# Patient Record
Sex: Male | Born: 1955
Health system: Southern US, Community
[De-identification: ages and names within clinical notes are randomized; demographics above are authoritative.]

## PROBLEM LIST (undated history)

## (undated) DIAGNOSIS — R9389 Abnormal findings on diagnostic imaging of other specified body structures: Secondary | ICD-10-CM

## (undated) DIAGNOSIS — Z9889 Other specified postprocedural states: Secondary | ICD-10-CM

## (undated) DIAGNOSIS — R112 Nausea with vomiting, unspecified: Secondary | ICD-10-CM

## (undated) DIAGNOSIS — S46009A Unspecified injury of muscle(s) and tendon(s) of the rotator cuff of unspecified shoulder, initial encounter: Secondary | ICD-10-CM

## (undated) DIAGNOSIS — K644 Residual hemorrhoidal skin tags: Secondary | ICD-10-CM

## (undated) HISTORY — DX: Unspecified injury of muscle(s) and tendon(s) of the rotator cuff of unspecified shoulder, initial encounter: S46.009A

## (undated) HISTORY — DX: Residual hemorrhoidal skin tags: K64.4

## (undated) HISTORY — DX: Abnormal findings on diagnostic imaging of other specified body structures: R93.89

---

## 1987-11-22 HISTORY — PX: OTHER SURGICAL HISTORY: SHX169

## 1998-11-21 HISTORY — PX: ROTATOR CUFF REPAIR: SHX139

## 2008-08-01 ENCOUNTER — Ambulatory Visit: Payer: Self-pay | Admitting: Internal Medicine

## 2008-08-01 DIAGNOSIS — M719 Bursopathy, unspecified: Secondary | ICD-10-CM

## 2008-08-01 DIAGNOSIS — K644 Residual hemorrhoidal skin tags: Secondary | ICD-10-CM | POA: Insufficient documentation

## 2008-08-01 DIAGNOSIS — M67919 Unspecified disorder of synovium and tendon, unspecified shoulder: Secondary | ICD-10-CM | POA: Insufficient documentation

## 2008-09-15 ENCOUNTER — Ambulatory Visit: Payer: Self-pay | Admitting: Unknown Physician Specialty

## 2010-02-25 ENCOUNTER — Ambulatory Visit: Payer: Self-pay | Admitting: Unknown Physician Specialty

## 2010-02-25 ENCOUNTER — Encounter: Payer: Self-pay | Admitting: Cardiology

## 2010-03-01 DIAGNOSIS — R930 Abnormal findings on diagnostic imaging of skull and head, not elsewhere classified: Secondary | ICD-10-CM

## 2010-03-02 ENCOUNTER — Ambulatory Visit: Payer: Self-pay | Admitting: Cardiology

## 2010-03-02 DIAGNOSIS — I2510 Atherosclerotic heart disease of native coronary artery without angina pectoris: Secondary | ICD-10-CM | POA: Insufficient documentation

## 2010-03-02 DIAGNOSIS — E782 Mixed hyperlipidemia: Secondary | ICD-10-CM

## 2010-03-03 ENCOUNTER — Encounter: Payer: Self-pay | Admitting: Cardiology

## 2010-03-03 ENCOUNTER — Ambulatory Visit: Payer: Self-pay | Admitting: Internal Medicine

## 2010-03-04 LAB — CONVERTED CEMR LAB
BUN: 21 mg/dL (ref 6–23)
Calcium: 9.6 mg/dL (ref 8.4–10.5)
Creatinine, Ser: 0.89 mg/dL (ref 0.40–1.50)
Potassium: 4.5 meq/L (ref 3.5–5.3)

## 2010-03-08 ENCOUNTER — Ambulatory Visit: Payer: Self-pay | Admitting: Cardiology

## 2010-03-08 ENCOUNTER — Ambulatory Visit (HOSPITAL_COMMUNITY): Admission: RE | Admit: 2010-03-08 | Discharge: 2010-03-08 | Payer: Self-pay | Admitting: Cardiology

## 2010-12-21 NOTE — Assessment & Plan Note (Signed)
Summary: NP6/Abnormal Ct-mb   Visit Type:  new pt visit Primary Provider:  Italy McQueen  CC:  no cardiac complaints today...  History of Present Illness: Eric Vargas comes in today for evaluation and management of  an abnormal CT scan on February 25, 2010 for coronary artery calcifications.  He is totally asymptomatic without angina or ischemic symptoms. He is an active runner. He does a lot of manual work as well.  His risk factors include age over 60, male sex, mild hyperlipidemia with total cholesterol 220 but a good HDL per his recollection, and family history. He also dips on occasion.  He denies any dyspnea on exertion, orthopnea, PND, palpitations or syncope. He does not smoke.  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 Neck:  Neck supple, no JVD. No masses, thyromegaly or abnormal cervical nodes. Chest Porschia [place]:  no deformities or breast masses  noted Lungs:  Clear bilaterally to auscultation and percussion. Heart:  Non-displaced PMI, chest non-tender; regular rate and rhythm, S1, S2 without murmurs, rubs or gallops. Carotid upstroke normal, no bruit. Normal abdominal aortic size, no bruits. Femorals normal pulses, no bruits. Pedals normal pulses. No edema, no varicosities. Abdomen:  Bowel sounds positive; abdomen soft and non-tender without masses, organomegaly, or hernias noted. No hepatosplenomegaly. Msk:  Back normal, normal gait. Muscle strength and tone normal. Pulses:  pulses normal in all 4 extremities Extremities:  No clubbing or cyanosis. Neurologic:  Alert and oriented x 3.   Problems:  Medical Problems Added: 1)  Dx of Hyperlipidemia Type Iib / Iii  (ICD-272.2) 2)  Dx of Hyperlipidemia Type Iib / Iii  (ICD-272.2) 3)  Dx of Cad, Unspecified Site  (ICD-414.00)  EKG  Procedure date:  03/02/2010  Findings:      sinus bradycardia, normal EKG otherwise  Impression & Recommendations:  Problem # 1:  CAD, UNSPECIFIED SITE (ICD-414.00) Assessment New I have explained to him that his coronary calcification on his CT scan is consistent with endothelial thickening, and there could be some plaque formation as well. His most significant risk factors size his age and being a male is his cholesterol 220. He states his HDL is good, however, I suspected the wise to decrease his LDL to 70. I recommend low-dose statin. He like to think about this. I also recommended low-dose aspirin 81 mg a day to decrease the risk of an acute thrombosis if plaque rupture were  to occur. He like to think about this as well.  Arrange to have a cardiac CT with Dr. Jearld Pies on Monday, 18 April. Indications risks potential benefits have been discussed. He understands his insurance problem not pay. He is fine with this.  Problem # 2:  CT, CHEST, ABNORMAL (ICD-793.1) Assessment: New See above. No other abnormalities.CT scan reviewed. Orders: EKG w/  Interpretation (93000) Cardiac CTA (Cardiac CTA)  Problem # 3:  HYPERLIPIDEMIA TYPE IIB / III (ICD-272.2) Assessment: Unchanged See  above discussion and recommendations.  Patient Instructions: 1)  Your physician recommends that you schedule a follow-up appointment in: AS NEEDED 2)  Your physician recommends that you continue on your current medications as directed. Please refer to the Current Medication list given to you today. 3)  Your physician has requested that you have a cardiac CT.  Cardiac computed tomography (CT) is a painless test that uses an x-ray machine to take clear, detailed pictures of your heart.  For further information please visit [email or web address]  Please follow instruction sheet as given.

## 2011-06-07 ENCOUNTER — Encounter: Payer: Self-pay | Admitting: Cardiology

## 2012-08-08 ENCOUNTER — Ambulatory Visit (INDEPENDENT_AMBULATORY_CARE_PROVIDER_SITE_OTHER): Payer: 59 | Admitting: Sports Medicine

## 2012-08-08 VITALS — BP 120/70 | Ht 70.0 in | Wt 200.0 lb

## 2012-08-08 DIAGNOSIS — M79609 Pain in unspecified limb: Secondary | ICD-10-CM

## 2012-08-08 DIAGNOSIS — M79662 Pain in left lower leg: Secondary | ICD-10-CM | POA: Insufficient documentation

## 2012-08-08 NOTE — Progress Notes (Signed)
Chief complaint: Left calf pain. Mr. Eric Vargas is a 56 year old male who is a runner coming in with left calf pain. Patient states she's had this problem intermittently for the last 5 years. Patient states though that it has increased in frequency as well as duration of pain. Patient states that he can start running in usually go approximately 3 miles before he starts having pain in his left calf, more on the lateral aspect. Patient states that this feels like a cramp but then has significant soreness thereafter. Patient then has soreness for the next 2-3 days afterwards where he is unable to tolerate any type of exercise. Patient denies any radiation of pain or any numbness or weakness in the extremity. Patient denies any true injury to that side previously. Patient also denies any large from injury to the left side. Patient denies any weakness numbness in the extremity distally. Patient also denies any foot drop. Patient has been told that he is an over pronator and has tried to get shoes to try to help but does not seem to help significantly. Patient has not tried ice or over-the-counter anti-inflammatories to this point. Patient is also not tried stretching. Patient was a sprinter previously In high school as well as in college and only in the last 10 years has he started doing longer distances.  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  Review of systems as stated in the history of present illness  Past Medical History  Diagnosis Date  . Abnormal chest CT   . Rotator cuff injury     Right shoulder  . Hemorrhoids, external    Past Surgical History  Procedure Date  . Disk repair 1989    At [facility]  . Rotator cuff repair 2000    Left   No family history on file. History  Substance Use Topics  . Smoking status: Never Smoker   . Smokeless tobacco: Not on file   . Alcohol Use: Yes   No Known Allergies  Physical Exam: Filed Vitals:   08/08/12 1017  BP: 120/70  Gen: No apparent distress alert and oriented x3 mood and affect are normal Respiratory: Patient speaks in full senses and does not appear short of breath Neuro: Patient is neurovascularly intact in all extremities, 2+ DTRs and symmetric in all extremities. Skin: Warm dry and intact no signs of rash Left lower extremity: On inspection no abnormality. Patient's calf laterally on palpation is nontender, patient's calf size measures symmetrically to the contralateral side. Patient has full flexion and extension at the ankle with no pain. Patient is able to stand on his toes without pain. Patient's hamstrings are minorly tight. On standing examination patient does have some mild overpronation of the hindfoot bilaterally as well as pes planus bilaterally. Patient has no significant varus or valgus deformity of the hindfoot. Gait analysis: Patient  walks he actually strikes on his forefoot in a supinated stance with varus deformity of the lower extremity. Feet turn in somewhat with left greater than right. When patient does jog this is exacerbated more. Patient jogs with a significant forefoot striking causing patient's calf to be tight throughout the striking and pushing off phase. Patient is neurovascularly intact distally with 2+ DTRs of the Achilles.    Patient was placed in sports insoles with heel lifts bilaterally that did help with  patient's overpronation patient states that running felt much more comfortable.

## 2012-08-08 NOTE — Assessment & Plan Note (Signed)
Patient's calf pain is likely exacerbated by patient's running style with forefoot striking as well as supination. Patient likely then has significant cramping of the lateral head of the gastrocnemius on the left side. Patient was given heel lifts in sports insults today to try to decrease the strain during running. In addition to this patient was given eccentric exercises to work on the Achilles as well as the calf. Patient also given the compression sleeve to wear on the left calf to try to help keep muscle warm during exercise and told to wear this 2 hours even after exercise. Patient will followup in one month's time if still having problem.  At that time will consider doing an ultrasound of the left calf looking for some type of tear but due to patient's duration of symptoms I do not think this is likely. The other possibility in the differential would be compression of the sural nerve by the compression by the lateral gastrocnemius head.

## 2012-08-08 NOTE — Patient Instructions (Signed)
Good to meet you. Wear the calf compression with activity and 2 hours afterward.  Wear the sports insoles when you run as well.  Do the exercises daily to help with stretching.  Come back in 1 month if still having trouble.

## 2015-05-03 ENCOUNTER — Encounter: Payer: Self-pay | Admitting: Emergency Medicine

## 2015-05-03 ENCOUNTER — Emergency Department: Payer: BLUE CROSS/BLUE SHIELD

## 2015-05-03 ENCOUNTER — Emergency Department
Admission: EM | Admit: 2015-05-03 | Discharge: 2015-05-03 | Disposition: A | Discharge status: Home / Self Care | Payer: BLUE CROSS/BLUE SHIELD | Attending: Emergency Medicine | Admitting: Emergency Medicine

## 2015-05-03 DIAGNOSIS — Z23 Encounter for immunization: Secondary | ICD-10-CM | POA: Diagnosis not present

## 2015-05-03 DIAGNOSIS — Y998 Other external cause status: Secondary | ICD-10-CM | POA: Insufficient documentation

## 2015-05-03 DIAGNOSIS — Y9289 Other specified places as the place of occurrence of the external cause: Secondary | ICD-10-CM | POA: Diagnosis not present

## 2015-05-03 DIAGNOSIS — W450XXA Nail entering through skin, initial encounter: Secondary | ICD-10-CM | POA: Insufficient documentation

## 2015-05-03 DIAGNOSIS — S99921A Unspecified injury of right foot, initial encounter: Secondary | ICD-10-CM

## 2015-05-03 DIAGNOSIS — S91331A Puncture wound without foreign body, right foot, initial encounter: Secondary | ICD-10-CM | POA: Insufficient documentation

## 2015-05-03 DIAGNOSIS — Y9389 Activity, other specified: Secondary | ICD-10-CM | POA: Diagnosis not present

## 2015-05-03 MED ORDER — TETANUS-DIPHTH-ACELL PERTUSSIS 5-2.5-18.5 LF-MCG/0.5 IM SUSP
0.5000 mL | Freq: Once | INTRAMUSCULAR | Status: AC
  Administered 2015-05-03: 0.5 mL via INTRAMUSCULAR

## 2015-05-03 MED ORDER — CIPROFLOXACIN HCL 500 MG PO TABS: 500.0000 mg | ORAL_TABLET | Freq: Two times a day (BID) | ORAL | Status: AC

## 2015-05-03 MED ORDER — TETANUS-DIPHTH-ACELL PERTUSSIS 5-2.5-18.5 LF-MCG/0.5 IM SUSP
INTRAMUSCULAR | Status: AC
  Administered 2015-05-03: 0.5 mL via INTRAMUSCULAR
  Filled 2015-05-03: qty 0.5

## 2015-05-03 NOTE — ED Notes (Signed)
Pt left prior to being D/C. Pt left after exam.

## 2015-05-03 NOTE — ED Notes (Signed)
Pt [place] on a rusty nail that came up through his shoe. Pt states nail was submerged in salt water. Pt states last tetanus shot is unknown.

## 2015-05-03 NOTE — ED Notes (Signed)
Pt states he stepped on rusty nail yesterday, unsure of when his last tetanus shot was. Presents today to get a tetanus shot.

## 2015-05-03 NOTE — ED Provider Notes (Signed)
[facility] Emergency Department Provider Note  ____________________________________________  Time seen: Approximately 9:55 AM  I have reviewed the triage vital signs and the nursing notes.   HISTORY  Chief Complaint Foot Injury   HPI Eric Vargas is a 59 y.o. male presents to the ER for the complaint of stepping on rusty nail in ocean yesterday and right foot. Patient says he is working on steps to go down into the water and states that he stepped directly on a nail sticking out of a board. Patient states that he had on rubber soled shoes and now to penetrate through his shoe. Patient states that nail was immediately pulled out as he pulled up his foot. Denies other injury.  States pain is currently a 4 out of 10 described as tenderness. Denies numbness or team and sensation. Denies other pain or injury.   Past Medical History  Diagnosis Date  . Abnormal chest CT   . Rotator cuff injury     Right shoulder  . Hemorrhoids, external     Patient Active Problem List   Diagnosis Date Noted  . Pain of left calf 08/08/2012  . HYPERLIPIDEMIA TYPE IIB / III 03/02/2010  . CAD, UNSPECIFIED SITE 03/02/2010  . Nonspecific (abnormal) findings on radiological and other examination of body structure 03/01/2010  . CT, CHEST, ABNORMAL 03/01/2010  . HEMORRHOIDS, EXTERNAL 08/01/2008  . ROTATOR CUFF INJURY, RIGHT SHOULDER 08/01/2008    Past Surgical History  Procedure Laterality Date  . Disk repair  1989    At [facility]  . Rotator cuff repair  2000    Left    No current outpatient prescriptions on file.  Allergies Review of patient's allergies indicates no known allergies.  History reviewed. No pertinent family history.  Social History History  Substance Use Topics  . Smoking status: Never Smoker   . Smokeless tobacco: Current User    Types: Chew  . Alcohol Use: Yes     Comment: occassionally    Review of Systems Constitutional: No fever/chills Eyes:  No visual changes. ENT: No sore throat. Cardiovascular: Denies chest pain. Respiratory: Denies shortness of breath. Gastrointestinal: No abdominal pain.  No nausea, no vomiting.  No diarrhea.  No constipation. Genitourinary: Negative for dysuria. Musculoskeletal: Negative for back pain. Positive for right foot pain Skin: Negative for rash. Neurological: Negative for headaches, focal weakness or numbness.  10-point ROS otherwise negative.  ____________________________________________   PHYSICAL EXAM:  VITAL SIGNS: ED Triage Vitals  Enc Vitals Group     BP 05/03/15 0915 132/87 mmHg     Pulse Rate 05/03/15 0915 67     Resp 05/03/15 0915 18     Temp 05/03/15 0915 98.1 F (36.7 C)     Temp Source 05/03/15 0915 Oral     SpO2 05/03/15 0915 97 %     Weight 05/03/15 0915 200 lb (90.719 kg)     Height 05/03/15 0915 5\' 10"  (1.778 m)     Head Cir --      Peak Flow --      Pain Score 05/03/15 0911 3     Pain Loc --      Pain Edu? --      Excl. in [place]? --     Constitutional: Alert and oriented. Well appearing and in no acute distress. Eyes: Conjunctivae are normal. PERRL. EOMI. Head: Atraumatic. Nose: No congestion/rhinnorhea. Mouth/Throat: Mucous membranes are moist.  Oropharynx non-erythematous. Neck: No stridor.  Hematological/Lymphatic/Immunilogical: No cervical lymphadenopathy. Cardiovascular: Normal rate,  regular rhythm. Grossly normal heart sounds.  Good peripheral circulation. Respiratory: Normal respiratory effort.  No retractions. Lungs CTAB. Gastrointestinal: Soft and nontender. No distention.  Musculoskeletal: No lower extremity tenderness nor edema.  No joint effusions. Except: Right plantar midfoot small area appearance of puncture wound. Clean, no surrounding erythema, induration or fluctuance. Mild TTP. Full ROM. Sensation intact to bilateral feet. Cap refill <2seconds. Distal pedal pulses equal bilaterally.  Neurologic:  Normal speech and language. No gross focal  neurologic deficits are appreciated. Speech is normal. No gait instability. Skin:  Skin is warm, dry and intact. No rash noted. Psychiatric: Mood and affect are normal. Speech and behavior are normal.  ________________________________________  RADIOLOGY  RIGHT FOOT COMPLETE - 3+ VIEW  COMPARISON: None.  FINDINGS: The site of the injury is not indicated. There is no evidence of acute fracture, dislocation or bone destruction. No radiopaque foreign body or soft tissue emphysema demonstrated. Mild midfoot and first MTP degenerative changes are noted. The tibial sesamoid of the first metatarsal is bipartite. Probable accessory navicular.  IMPRESSION: No acute osseous findings, foreign body or soft tissue emphysema demonstrated.   Electronically Signed By: Richardean Sale M.D. On: 05/03/2015 10:14 _______________________________________________________________________________________   INITIAL IMPRESSION / ASSESSMENT AND PLAN / ED COURSE  Pertinent labs & imaging results that were available during my care of the patient were reviewed by me and considered in my medical decision making (see chart for details).  Well appearing. NO acute distress. Right foot plantar nail injury through rubber soled shoe. X-ray negative for foreign body. No erythema, no signs of infection. Tetanus immunization given in ER. Will start PO ciprofloxacin and follow up with PCP.   1025: Went to room to discuss xray findings with pt and pt not in room.   1035: pt still not in room. Pt left ER unannounced. RN notified. Prescription and discharge information printed and given to RN. Pt alert and oriented with decisional capacity.  ____________________________________________   FINAL CLINICAL IMPRESSION(S) / ED DIAGNOSES  Final diagnoses:  Injury of plantar aspect of foot, right, initial encounter  Puncture wound of foot, right, initial encounter  stepped on nail right plantar foot    Marylene Land, NP 05/03/15 [place], MD 05/03/15 1059

## 2015-05-03 NOTE — Discharge Instructions (Signed)
Keep clean. Apply topical antibiotic ointment. Elevate foot. Medication as prescribed.  Follow-up with her primary care physician or with the above as needed.  Return to the ER for new or worsening concerns.

## 2016-12-20 ENCOUNTER — Telehealth: Payer: Self-pay

## 2016-12-20 NOTE — Telephone Encounter (Signed)
Patient called to be scheduled for a colonoscopy.

## 2016-12-21 ENCOUNTER — Other Ambulatory Visit: Payer: Self-pay

## 2016-12-21 ENCOUNTER — Encounter: Payer: Self-pay | Admitting: Gastroenterology

## 2016-12-21 ENCOUNTER — Ambulatory Visit (INDEPENDENT_AMBULATORY_CARE_PROVIDER_SITE_OTHER): Payer: BLUE CROSS/BLUE SHIELD | Admitting: Gastroenterology

## 2016-12-21 VITALS — BP 133/87 | HR 74 | Temp 97.7°F | Ht 70.0 in | Wt 223.0 lb

## 2016-12-21 DIAGNOSIS — R109 Unspecified abdominal pain: Secondary | ICD-10-CM | POA: Diagnosis not present

## 2016-12-21 NOTE — Patient Instructions (Addendum)
You are scheduled for a CT Scan Abdomen/Pelvis with contrast at [facility] location on Wednesday, Feb 7th at 8:00am. Please arrive at 7:30am and have nothing to eat or drink 4 hours prior.

## 2016-12-21 NOTE — Progress Notes (Signed)
Gastroenterology Consultation  Referring Provider:     No ref. provider found Primary Care Physician:  No PCP Per Patient Primary Gastroenterologist:  Dr. Allen Norris     Reason for Consultation:     Abdominal pain        HPI:   Eric Vargas is a 61 y.o. y/o male referred for consultation & management of Abdominal pain by Dr. Rayne Du PCP Per Patient.  This patient comes in today with a history of abdominal pain.  The patient states the abdominal pain started approximately 1 month ago.  The patient was significantly worse one month ago but has gotten better but has not completely resolved.  The patient denies the abdominal pain to be need any better or worse with eating or drinking.  He also denies any association with his bowel movements.  The patient has not had any unexplained weight loss.  The patient does report that he was sent a letter by his insurance company stating that he was due for another colonoscopy.  There is no report of any laxatives or bloody stools.  The patient also reports that he only has the abdominal pain now when he pushes on his abdomen.  Past Medical History:  Diagnosis Date  . Abnormal chest CT   . Hemorrhoids, external   . Rotator cuff injury    Right shoulder    Past Surgical History:  Procedure Laterality Date  . Disk repair  1989   At [facility]  . ROTATOR CUFF REPAIR  2000   Left    Prior to Admission medications   Medication Sig Start Date End Date Taking? Authorizing Provider  meclizine (ANTIVERT) 25 MG tablet  12/02/16   Historical Provider, MD  TRANSDERM-SCOP, 1.5 MG, 1 MG/3DAYS  12/02/16   Historical Provider, MD    History reviewed. No pertinent family history.   Social History  Substance Use Topics  . Smoking status: Never Smoker  . Smokeless tobacco: Current User    Types: Chew  . Alcohol use Yes     Comment: occassionally    Allergies as of 12/21/2016  . (No Known Allergies)    Review of Systems:    All systems reviewed and negative  except where noted in HPI.   Physical Exam:  BP 133/87   Pulse 74   Temp 97.7 F (36.5 C) (Oral)   Ht 5\' 10"  (1.778 m)   Wt 223 lb (101.2 kg)   BMI 32.00 kg/m  No LMP for male patient. Psych:  Alert and cooperative. Normal mood and affect. General:   Alert,  Well-developed, well-nourished, pleasant and cooperative in NAD Head:  Normocephalic and atraumatic. Eyes:  Sclera clear, no icterus.   Conjunctiva pink. Ears:  Normal auditory acuity. Nose:  No deformity, discharge, or lesions. Mouth:  No deformity or lesions,oropharynx pink & moist. Neck:  Supple; no masses or thyromegaly. Lungs:  Respirations even and unlabored.  Clear throughout to auscultation.   No wheezes, crackles, or rhonchi. No acute distress. Heart:  Regular rate and rhythm; no murmurs, clicks, rubs, or gallops. Abdomen:  Normal bowel sounds.  No bruits.  Soft, Tenderness on deep palpation of his abdomen and more on the left than on the right but both in the suprapubic area and non-distended without masses, hepatosplenomegaly or hernias noted.  No guarding or rebound tenderness.  Negative Carnett sign.   Rectal:  Deferred.  Msk:  Symmetrical without gross deformities.  Good, equal movement & strength bilaterally. Pulses:  Normal pulses  noted. Extremities:  No clubbing or edema.  No cyanosis. Neurologic:  Alert and oriented x3;  grossly normal neurologically. Skin:  Intact without significant lesions or rashes.  No jaundice. Lymph Nodes:  No significant cervical adenopathy. Psych:  Alert and cooperative. Normal mood and affect.  Imaging Studies: No results found.  Assessment and Plan:   Eric Vargas is a 61 y.o. y/o male who is in need of a screening colonoscopy after receiving a letter from his insurance coverage. The patient has abdominal pain but the abdominal pain is not related to eating drinking or any bowel movements.  The patient may have some extraintestinal cause for his abdominal pain including kidney  stones versus epiploic appendagitis.  The patient will be set up for CT scan of the abdomen and pelvis in addition to his screening colonoscopy.  The patient has been explained the plan and agrees with it.    [name], MD. Marval Regal   Note: This dictation was prepared with Dragon dictation along with smaller phrase technology. Any transcriptional errors that result from this process are unintentional.

## 2016-12-22 NOTE — Telephone Encounter (Signed)
Was seen in office prior to scheduling colonoscopy. Set up for 12/30/16.

## 2016-12-27 NOTE — Discharge Instructions (Signed)

## 2016-12-28 ENCOUNTER — Ambulatory Visit
Admission: RE | Admit: 2016-12-28 | Discharge: 2016-12-28 | Disposition: A | Discharge status: Home / Self Care | Payer: BLUE CROSS/BLUE SHIELD | Source: Ambulatory Visit | Attending: Gastroenterology | Admitting: Gastroenterology

## 2016-12-28 DIAGNOSIS — R109 Unspecified abdominal pain: Secondary | ICD-10-CM | POA: Insufficient documentation

## 2016-12-28 DIAGNOSIS — K760 Fatty (change of) liver, not elsewhere classified: Secondary | ICD-10-CM | POA: Diagnosis not present

## 2016-12-28 DIAGNOSIS — M48061 Spinal stenosis, lumbar region without neurogenic claudication: Secondary | ICD-10-CM | POA: Diagnosis not present

## 2016-12-28 DIAGNOSIS — K5730 Diverticulosis of large intestine without perforation or abscess without bleeding: Secondary | ICD-10-CM | POA: Diagnosis not present

## 2016-12-28 DIAGNOSIS — I708 Atherosclerosis of other arteries: Secondary | ICD-10-CM | POA: Insufficient documentation

## 2016-12-28 DIAGNOSIS — M47896 Other spondylosis, lumbar region: Secondary | ICD-10-CM | POA: Insufficient documentation

## 2016-12-28 DIAGNOSIS — R918 Other nonspecific abnormal finding of lung field: Secondary | ICD-10-CM | POA: Diagnosis not present

## 2016-12-28 DIAGNOSIS — I700 Atherosclerosis of aorta: Secondary | ICD-10-CM | POA: Diagnosis not present

## 2016-12-28 MED ORDER — IOPAMIDOL (ISOVUE-300) INJECTION 61%
125.0000 mL | Freq: Once | INTRAVENOUS | Status: AC | PRN
  Administered 2016-12-28: 125 mL via INTRAVENOUS

## 2016-12-30 ENCOUNTER — Encounter
Admission: RE | Disposition: A | Discharge status: Home / Self Care | Payer: Self-pay | Source: Ambulatory Visit | Attending: Gastroenterology

## 2016-12-30 ENCOUNTER — Ambulatory Visit: Payer: BLUE CROSS/BLUE SHIELD | Admitting: Anesthesiology

## 2016-12-30 ENCOUNTER — Ambulatory Visit
Admission: RE | Admit: 2016-12-30 | Discharge: 2016-12-30 | Disposition: A | Discharge status: Home / Self Care | Payer: BLUE CROSS/BLUE SHIELD | Source: Ambulatory Visit | Attending: Gastroenterology | Admitting: Gastroenterology

## 2016-12-30 DIAGNOSIS — K5730 Diverticulosis of large intestine without perforation or abscess without bleeding: Secondary | ICD-10-CM | POA: Diagnosis not present

## 2016-12-30 DIAGNOSIS — K640 First degree hemorrhoids: Secondary | ICD-10-CM | POA: Diagnosis not present

## 2016-12-30 DIAGNOSIS — Z01818 Encounter for other preprocedural examination: Secondary | ICD-10-CM

## 2016-12-30 DIAGNOSIS — I2510 Atherosclerotic heart disease of native coronary artery without angina pectoris: Secondary | ICD-10-CM | POA: Insufficient documentation

## 2016-12-30 DIAGNOSIS — Z1211 Encounter for screening for malignant neoplasm of colon: Secondary | ICD-10-CM | POA: Diagnosis not present

## 2016-12-30 HISTORY — PX: COLONOSCOPY WITH PROPOFOL: SHX5780

## 2016-12-30 SURGERY — COLONOSCOPY WITH PROPOFOL
Anesthesia: Monitor Anesthesia Care | Wound class: Contaminated

## 2016-12-30 MED ORDER — LACTATED RINGERS IV SOLN
INTRAVENOUS | Status: DC
  Administered 2016-12-30: 08:00:00 via INTRAVENOUS

## 2016-12-30 MED ORDER — ACETAMINOPHEN 160 MG/5ML PO SOLN: 325.0000 mg | ORAL | Status: DC | PRN

## 2016-12-30 MED ORDER — SIMETHICONE 40 MG/0.6ML PO SUSP
ORAL | Status: DC | PRN
  Administered 2016-12-30: 08:00:00

## 2016-12-30 MED ORDER — PROPOFOL 10 MG/ML IV BOLUS
INTRAVENOUS | Status: DC | PRN
  Administered 2016-12-30 (×2): 40 mg via INTRAVENOUS
  Administered 2016-12-30: 60 mg via INTRAVENOUS
  Administered 2016-12-30 (×2): 40 mg via INTRAVENOUS

## 2016-12-30 MED ORDER — LIDOCAINE HCL (CARDIAC) 20 MG/ML IV SOLN
INTRAVENOUS | Status: DC | PRN
  Administered 2016-12-30: 30 mg via INTRAVENOUS

## 2016-12-30 MED ORDER — ACETAMINOPHEN 325 MG PO TABS: 325.0000 mg | ORAL_TABLET | ORAL | Status: DC | PRN

## 2016-12-30 SURGICAL SUPPLY — 23 items

## 2016-12-30 NOTE — Op Note (Signed)
[facility] Gastroenterology Patient Name: Eric Vargas Procedure Date: 12/30/2016 8:00 AM MRN: [id number] Account #: [id number] Date of Birth: 04/29/1956 Admit Type: Outpatient Age: 61 Room: [facility] OR ROOM 01 Gender: Male Note Status: Finalized Procedure:            Colonoscopy Indications:          Screening for colorectal malignant neoplasm Providers:            [name] MD, MD Referring MD:         Roena Malady, MD (Referring MD) Medicines:            Propofol per Anesthesia Complications:        No immediate complications. Procedure:            Pre-Anesthesia Assessment:                       - Prior to the procedure, a History and Physical was                        performed, and patient medications and allergies were                        reviewed. The patient's tolerance of previous                        anesthesia was also reviewed. The risks and benefits of                        the procedure and the sedation options and risks were                        discussed with the patient. All questions were                        answered, and informed consent was obtained. Prior                        Anticoagulants: The patient has taken no previous                        anticoagulant or antiplatelet agents. ASA Grade                        Assessment: II - A patient with mild systemic disease.                        After reviewing the risks and benefits, the patient was                        deemed in satisfactory condition to undergo the                        procedure.                       After obtaining informed consent, the colonoscope was                        passed under direct vision. Throughout the procedure,  the patient's blood pressure, pulse, and oxygen                        saturations were monitored continuously. The Olympus CF                        H180AL Colonoscope (S#: [id number]) was introduced  through                        the anus and advanced to the the cecum, identified by                        appendiceal orifice and ileocecal valve. The                        colonoscopy was performed without difficulty. The                        patient tolerated the procedure well. The quality of                        the bowel preparation was excellent. Findings:      The perianal and digital rectal examinations were normal.      A few small-mouthed diverticula were found in the sigmoid colon.      Non-bleeding internal hemorrhoids were found during retroflexion. The       hemorrhoids were Grade I (internal hemorrhoids that do not prolapse). Impression:           - Diverticulosis in the sigmoid colon.                       - Non-bleeding internal hemorrhoids.                       - No specimens collected. Recommendation:       - Discharge patient to home.                       - Resume previous diet.                       - Continue present medications.                       - Await pathology results. Procedure Code(s):    --- Professional ---                       [phone], Colonoscopy, flexible; diagnostic, including                        collection of specimen(s) by brushing or washing, when                        performed (separate procedure) Diagnosis Code(s):    --- Professional ---                       Z12.11, Encounter for screening for malignant neoplasm                        of colon CPT copyright 2016 American Medical Association. All rights reserved. The codes  documented in this report are preliminary and upon coder review may  be revised to meet current compliance requirements. [name] MD, MD 12/30/2016 8:29:02 AM This report has been signed electronically. Number of Addenda: 0 Note Initiated On: 12/30/2016 8:00 AM Scope Withdrawal Time: 0 hours 7 minutes 18 seconds  Total Procedure Duration: 0 hours 8 minutes 53 seconds       [facility]

## 2016-12-30 NOTE — H&P (Signed)
  [name], MD [address] [place], [place] [address] Phone: [phone] Fax : [phone]  Primary Care Physician:  No PCP Per Patient Primary Gastroenterologist:  Dr. Allen Norris  Pre-Procedure History & Physical: HPI:  Eric Vargas is a 61 y.o. male is here for a screening colonoscopy.   Past Medical History:  Diagnosis Date  . Abnormal chest CT   . Hemorrhoids, external   . Rotator cuff injury    Right AND LEFT shoulder    Past Surgical History:  Procedure Laterality Date  . Disk repair  1989   At [facility]  . ROTATOR CUFF REPAIR  2000   Left AND RIGHT    Prior to Admission medications   Medication Sig Start Date End Date Taking? Authorizing Provider  Bioflavonoid Products (ESTER C PO) Take by mouth daily.   Yes Historical Provider, MD  meclizine (ANTIVERT) 25 MG tablet  12/02/16   Historical Provider, MD  TRANSDERM-SCOP, 1.5 MG, 1 MG/3DAYS  12/02/16   Historical Provider, MD    Allergies as of 12/21/2016  . (No Known Allergies)    History reviewed. No pertinent family history.  Social History   Social History  . Marital status: Married    Spouse name: N/A  . Number of children: 4  . Years of education: N/A   Occupational History  . [place], Special educational needs teacher    Social History Main Topics  . Smoking status: Never Smoker  . Smokeless tobacco: Current User    Types: Chew  . Alcohol use Yes     Comment: occassionally  . Drug use: No  . Sexual activity: Not on file   Other Topics Concern  . Not on file   Social History Narrative   Farms.   Separated.   Runs occasionally.    Review of Systems: See HPI, otherwise negative ROS  Physical Exam: BP 132/76   Pulse 66   Temp 98.8 F (37.1 C) (Tympanic)   Resp 16   Ht 5\' 10"  (1.778 m)   Wt 214 lb (97.1 kg)   SpO2 99%   BMI 30.71 kg/m  General:   Alert,  pleasant and cooperative in NAD Head:  Normocephalic and atraumatic. Neck:  Supple; no masses or thyromegaly. Lungs:   Clear throughout to auscultation.    Heart:  Regular rate and rhythm. Abdomen:  Soft, nontender and nondistended. Normal bowel sounds, without guarding, and without rebound.   Neurologic:  Alert and  oriented x4;  grossly normal neurologically.  Impression/Plan: TAISEAN RISKO is now here to undergo a screening colonoscopy.  Risks, benefits, and alternatives regarding colonoscopy have been reviewed with the patient.  Questions have been answered.  All parties agreeable.

## 2016-12-30 NOTE — Anesthesia Preprocedure Evaluation (Signed)
Anesthesia Evaluation  Patient identified by MRN, date of birth, ID band Patient awake    Reviewed: Allergy & Precautions, NPO status , Patient's Chart, lab work & pertinent test results  Airway Mallampati: II  TM Distance: >3 FB Neck ROM: Full    Dental   Pulmonary    Pulmonary exam normal        Cardiovascular + CAD  Normal cardiovascular exam     Neuro/Psych    GI/Hepatic   Endo/Other    Renal/GU      Musculoskeletal   Abdominal   Peds  Hematology   Anesthesia Other Findings   Reproductive/Obstetrics                             Anesthesia Physical Anesthesia Plan  ASA: II  Anesthesia Plan: MAC   Post-op Pain Management:    Induction: Intravenous  Airway Management Planned:   Additional Equipment:   Intra-op Plan:   Post-operative Plan:   Informed Consent: I have reviewed the patients History and Physical, chart, labs and discussed the procedure including the risks, benefits and alternatives for the proposed anesthesia with the patient or authorized representative who has indicated his/her understanding and acceptance.     Plan Discussed with: CRNA  Anesthesia Plan Comments:         Anesthesia Quick Evaluation

## 2016-12-30 NOTE — Anesthesia Postprocedure Evaluation (Signed)
Anesthesia Post Note  Patient: Eric Vargas  Procedure(s) Performed: Procedure(s) (LRB): COLONOSCOPY WITH PROPOFOL (N/A)  Patient location during evaluation: PACU Anesthesia Type: MAC Level of consciousness: awake and alert Pain management: pain level controlled Vital Signs Assessment: post-procedure vital signs reviewed and stable Respiratory status: spontaneous breathing, nonlabored ventilation, respiratory function stable and patient connected to nasal cannula oxygen Cardiovascular status: stable and blood pressure returned to baseline Anesthetic complications: no    Amaryllis Dyke

## 2016-12-30 NOTE — Transfer of Care (Signed)
Immediate Anesthesia Transfer of Care Note  Patient: Eric Vargas  Procedure(s) Performed: Procedure(s) with comments: COLONOSCOPY WITH PROPOFOL (N/A) - wants to be as early as possible  Patient Location: PACU  Anesthesia Type: MAC  Level of Consciousness: awake, alert  and patient cooperative  Airway and Oxygen Therapy: Patient Spontanous Breathing and Patient connected to supplemental oxygen  Post-op Assessment: Post-op Vital signs reviewed, Patient's Cardiovascular Status Stable, Respiratory Function Stable, Patent Airway and No signs of Nausea or vomiting  Post-op Vital Signs: Reviewed and stable  Complications: No apparent anesthesia complications

## 2017-01-02 ENCOUNTER — Encounter: Payer: Self-pay | Admitting: Gastroenterology

## 2017-01-05 ENCOUNTER — Telehealth: Payer: Self-pay

## 2017-01-05 NOTE — Telephone Encounter (Signed)
-----   Message from [name], MD sent at 12/29/2016 10:50 AM EST ----- Let the patient know that his CT scan showed diverticulosis, some fatty liver and a nodule in the right middle lobe of his lung that was recommended to repeat in 12 months with a chest CT to make sure that it has not progressed.

## 2017-01-05 NOTE — Telephone Encounter (Signed)
Pt notified of CT scan results and the need for repeat chest CT in 1 year.

## 2017-05-11 ENCOUNTER — Other Ambulatory Visit: Payer: Self-pay | Admitting: Unknown Physician Specialty

## 2017-05-11 DIAGNOSIS — R42 Dizziness and giddiness: Secondary | ICD-10-CM

## 2017-05-18 ENCOUNTER — Ambulatory Visit
Admission: RE | Admit: 2017-05-18 | Discharge: 2017-05-18 | Disposition: A | Discharge status: Home / Self Care | Payer: BLUE CROSS/BLUE SHIELD | Source: Ambulatory Visit | Attending: Unknown Physician Specialty | Admitting: Unknown Physician Specialty

## 2017-05-18 DIAGNOSIS — R42 Dizziness and giddiness: Secondary | ICD-10-CM | POA: Diagnosis not present

## 2017-05-18 MED ORDER — GADOBENATE DIMEGLUMINE 529 MG/ML IV SOLN
20.0000 mL | Freq: Once | INTRAVENOUS | Status: AC | PRN
  Administered 2017-05-18: 19 mL via INTRAVENOUS

## 2017-12-26 DIAGNOSIS — R42 Dizziness and giddiness: Secondary | ICD-10-CM | POA: Diagnosis not present

## 2017-12-26 DIAGNOSIS — H8110 Benign paroxysmal vertigo, unspecified ear: Secondary | ICD-10-CM | POA: Diagnosis not present

## 2017-12-27 ENCOUNTER — Other Ambulatory Visit: Payer: Self-pay | Admitting: Unknown Physician Specialty

## 2017-12-27 DIAGNOSIS — R42 Dizziness and giddiness: Secondary | ICD-10-CM

## 2017-12-29 ENCOUNTER — Ambulatory Visit: Payer: BLUE CROSS/BLUE SHIELD | Attending: Unknown Physician Specialty | Admitting: Physical Therapy

## 2017-12-29 ENCOUNTER — Encounter: Payer: Self-pay | Admitting: Physical Therapy

## 2017-12-29 ENCOUNTER — Other Ambulatory Visit: Payer: Self-pay | Admitting: Unknown Physician Specialty

## 2017-12-29 ENCOUNTER — Other Ambulatory Visit: Payer: Self-pay

## 2017-12-29 DIAGNOSIS — R42 Dizziness and giddiness: Secondary | ICD-10-CM | POA: Diagnosis not present

## 2017-12-29 DIAGNOSIS — H8112 Benign paroxysmal vertigo, left ear: Secondary | ICD-10-CM | POA: Diagnosis not present

## 2017-12-29 NOTE — Therapy (Signed)
[place] MAIN [facility] SERVICES [address] [place], [place], [address] Phone: [phone]   Fax:  [phone]  Physical Therapy Evaluation  Patient Details  Name: Eric Vargas MRN: [id number] Date of Birth: 1956-07-22 No Data Recorded  Encounter Date: 12/29/2017  PT End of Session - 12/29/17 1051    Visit Number  1    Number of Visits  17    Date for PT Re-Evaluation  02/23/18    PT Start Time  1055    PT Stop Time  1155    PT Time Calculation (min)  60 min    Activity Tolerance  Patient tolerated treatment well    Behavior During Therapy  [facility] for tasks assessed/performed       Past Medical History:  Diagnosis Date  . Abnormal chest CT   . Hemorrhoids, external   . Rotator cuff injury    Right AND LEFT shoulder    Past Surgical History:  Procedure Laterality Date  . COLONOSCOPY WITH PROPOFOL N/A 12/30/2016   Procedure: COLONOSCOPY WITH PROPOFOL;  Surgeon: [name], MD;  Location: [place];  Service: Endoscopy;  Laterality: N/A;  wants to be as early as possible  . Disk repair  1989   At [facility]  . ROTATOR CUFF REPAIR  2000   Left AND RIGHT    There were no vitals filed for this visit.     VESTIBULAR AND BALANCE EVALUATION  HISTORY:  Subjective history of current problem: Patient report he had sudden onset vertigo 8 weeks ago when he woke up and tried to get up to use the restroom. Patient reports that he primarily sleeps on his right side and that he had been tossing and turning the night of onset of his symptoms. Patient reports he has a "sensitive inner ear" and reports he has been getting nausea at times in addition to vertigo. Patient states he received a prescription for Valium and Meclizine from Dr. Gemma Payor for treatment of his vestibular symptoms. Patient states he does not like to take the Meclizine and is currently not taking the Meclizine. Patient reports that he has been going 1 or 2 times a week for 8  weeks to physical therapy for treatment of BPPV. Patient states his symptoms have minimally improved thus far with therapy. Patient reports that the PT tried the Bar BQ roll and the Epley manuever. Patient reports the PT also tried a procedure to try to "unstick it where you lean over to the right and turn your head".  Patient reports that he has been sleeping in a recliner chair since onset of his symptoms because he reports lying down and turning his head brings on his symptoms. Patient reports that turning his head to the right will cause everything to spin upwards and reports he gets mild spinning sensation with left head turns. Patient denies any recent falls or head injuries, but states he had a sinus infection a few weeks before onset of this symptoms 8 weeks ago. Patient reports that he feels he is more sensitive to caffeine now and states it "seems like I am on a different planet" after drinking a beverage with caffeine. Patient reports that he has been out of work for the past 8 weeks secondary to his vestibular issues. 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   Description of dizziness: vertigo, unsteadiness,   Frequency:  reports it occurs every time he lies down and turns his Duration: 30-45 seconds Symptom nature: motion provoked, positional Provocative Factors: lying down, rolling to the right and mildly to left, tipping head to the right Easing Factors: meclizine helped with the nausea, sitting upright   Progression of symptoms: better- reports "nothing is spinning the way it was"  History of similar episodes: yes, 6 months ago patient reports that he went to ENT physician and received vestibular PT. Patient reports the PT was able to resolved the BPPV and states it took 4-6 weeks Falls (yes/no): no Number of falls in past 6 months:  none   Auditory complaints (tinnitus, pain, drainage): tinnitus in right ear  that patient states is chronic Vision (last eye exam, diplopia, recent changes): denies   EXAMINATION        COORDINATION: Finger to Nose:  Normal Past Pointing:   Normal  MUSCULOSKELETAL SCREEN: Cervical Spine ROM: AROM cervical ROM WFL without pain   Gait: Patient arrives to clinic ambulating without AD. Patient ambulates with good cadence, reciprocal arm swing with good step length. Patient is able to ambulate at varying gait speeds without evidence of imbalance and able to perform quick turns L and R without imbalance.  Scanning of visual environment with gait is: good  Balance: Plan to test modified CTSIB next session.   OCULOMOTOR / VESTIBULAR TESTING:  Oculomotor Exam- Room Light  Normal Abnormal Comments  Ocular Alignment N    Ocular ROM N    Spontaneous Nystagmus N    End-Gaze Nystagmus N    Smooth Pursuit N    Saccades  ABN Hypometric saccades to the right side and noted a few hypometric saccades to the left  VOR   Deferred secondary to positive BPPV testing  VOR Cancellation N    Left Head Thrust   Deferred secondary to positive BPPV testing  Right Head Thrust   Deferred secondary to positive BPPV testing  Head Shaking Nystagmus   Deferred secondary to positive BPPV testing    BPPV TESTS:  Symptoms Duration Intensity Nystagmus  L Dix-Hallpike none   none  R Dix-Hallpike none 70-90 seconds  After about 5-10 second latency noted a few lateral beats and then noted downbeating left torsional nystagmus which lasted about 70-90 seconds  L Head Roll vertigo 30 seconds 3/10 Ageotrophic horizontal nystagmus  R Head Roll vertigo 70-90 seconds 5/10 Ageotrophic horizontal nystagmus    FUNCTIONAL OUTCOME MEASURES:  Results Comments  DHI 14 Low perception of handicap  ABC Scale 78% Mild impairment; in need of intervention  DGI 24/24 normal    Positioning Manuevers: Attempted to convert ageotrophic nystagmus to geotrophic nystagmus so that we could begin Bar BQ roll  treatment.  Patient performed 2 sets of 20 reps in supine with 30 degree neck flexion rapid head turns left to right to left repeats.  Patient performed 2 sets of 20 reps seated in arm chair 60 degree forward head pitch followed by 45 degree backwards head pitches repeats However, pt was not able to convert this date.  Patient issued supine head turns and seated forward/backward head pitches 10 reps times 3Xs/day for home exercise program.   PT Education - 12/29/17 1051    Education provided  Yes    Education Details  educated as to BPPV and information from [email or web address].org provided; discussed plan of care and HEP    Person(s) Educated  Patient    Methods  Explanation;Handout;Verbal cues;Demonstration    Comprehension  Verbalized understanding;Returned  demonstration       PT Short Term Goals - 12/29/17 1526      PT SHORT TERM GOAL #1   Title  Patient will report 50% or greater improvement in his symptoms of dizziness and imbalance with provoking motions or positions.    Time  4    Period  Weeks    Status  New    Target Date  01/26/18        PT Long Term Goals - 12/29/17 1521      PT LONG TERM GOAL #1   Title  Patient will have demonstrate decreased falls risk as indicated by Activities Specific Balance Confidence Scale score of 80% or greater.    Baseline  Scored 78% on 12/29/17    Time  8    Period  Weeks    Status  New    Target Date  02/23/18      PT LONG TERM GOAL #2   Title  Patient reports no vertigo with provoking motions or positions.    Time  8    Period  Weeks    Status  New    Target Date  02/23/18             Plan - 12/29/17 1502    Clinical Impression Statement  Patient presents with positive left and right head roll tests demonstrating ageotrophic horizontal nystagmus that could be consistent with left horizontal canal cupulolithiasis. Patient also noted to have hypometric saccades with vestibulo-ocular testing. Attempted to convert the ageotrophic  nystagmus to geotrophic. However, unable to convert during this session. Therefore, patient issued exercise for HEP and plans to return to clinic next week for further treatment. Patient would benefit from continued PT services to address symptoms and to perform maneuvers to help treat the horizontal canal BPPV.      History and Personal Factors relevant to plan of care:  age, profession, co-morbidities-coronary artery disease    Clinical Presentation  Evolving    Clinical Decision Making  Moderate    Rehab Potential  Good    Clinical Impairments Affecting Rehab Potential  positive indicators: motivated, responded to therapy in the past  negative indicators: patient has been treated at another PT clinic for 8 weeks without resolution of symptoms    PT Frequency  2x / week    PT Duration  8 weeks    PT Treatment/Interventions  Canalith Repostioning;Neuromuscular re-education;Balance training;Patient/family education;Vestibular    PT Next Visit Plan  repeat attempts to convert horizontal cupulolithiasis to canalithiasis     PT Home Exercise Plan  modified BD for HC BPPV 10Xs 3 times a day and Forward/backward head pitch repeats 10Xs 3 times a day          Patient will benefit from skilled therapeutic intervention in order to improve the following deficits and impairments:  Dizziness, Decreased balance  Visit Diagnosis: Dizziness and giddiness - Plan: PT plan of care cert/re-cert  BPPV (benign paroxysmal positional vertigo), left - Plan: PT plan of care cert/re-cert     Problem List Patient Active Problem List   Diagnosis Date Noted  . Special screening for malignant neoplasms, colon   . Pain of left calf 08/08/2012  . HYPERLIPIDEMIA TYPE IIB / III 03/02/2010  . CAD, UNSPECIFIED SITE 03/02/2010  . Nonspecific (abnormal) findings on radiological and other examination of body structure 03/01/2010  . CT, CHEST, ABNORMAL 03/01/2010  . HEMORRHOIDS, EXTERNAL 08/01/2008  . ROTATOR CUFF  INJURY, RIGHT SHOULDER 08/01/2008  Lady Deutscher PT, DPT [id number] Lady Deutscher 12/29/2017, 4:16 PM  [place] MAIN [facility] SERVICES [address] [place], [place], [address] Phone: [phone]   Fax:  [phone]  Name: MELVIN WHITEFORD MRN: [id number] Date of Birth: 18-Sep-1956

## 2018-01-02 ENCOUNTER — Ambulatory Visit: Payer: BLUE CROSS/BLUE SHIELD | Admitting: Physical Therapy

## 2018-01-02 ENCOUNTER — Encounter: Payer: Self-pay | Admitting: Physical Therapy

## 2018-01-02 DIAGNOSIS — R42 Dizziness and giddiness: Secondary | ICD-10-CM | POA: Diagnosis not present

## 2018-01-02 DIAGNOSIS — H8112 Benign paroxysmal vertigo, left ear: Secondary | ICD-10-CM | POA: Diagnosis not present

## 2018-01-02 NOTE — Therapy (Signed)
[place] MAIN [facility] SERVICES [address] [place], [place], [address] Phone: [phone]   Fax:  [phone]  Physical Therapy Treatment  Patient Details  Name: Eric Vargas MRN: [id number] Date of Birth: 12-29-1955 No Data Recorded  Encounter Date: 01/02/2018  PT End of Session - 01/02/18 0900    Visit Number  2    Number of Visits  17    Date for PT Re-Evaluation  02/23/18    PT Start Time  0900    Activity Tolerance  Patient tolerated treatment well    Behavior During Therapy  [facility] for tasks assessed/performed       Past Medical History:  Diagnosis Date  . Abnormal chest CT   . Hemorrhoids, external   . Rotator cuff injury    Right AND LEFT shoulder    Past Surgical History:  Procedure Laterality Date  . COLONOSCOPY WITH PROPOFOL N/A 12/30/2016   Procedure: COLONOSCOPY WITH PROPOFOL;  Surgeon: [name], MD;  Location: [place];  Service: Endoscopy;  Laterality: N/A;  wants to be as early as possible  . Disk repair  1989   At [facility]  . ROTATOR CUFF REPAIR  2000   Left AND RIGHT    There were no vitals filed for this visit.  Subjective Assessment - 01/04/18 0913    Subjective  Patient reports that he tried to sleep on his side but ended up sleeping in his recliner chair. Patient reports that he did do part of his home exercises-the supine with heaad turns. 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  No/denies       Neuromuscular Re-education:  Repositioning Manuever: On mat table, performed left HC cupulolith repositioning maneuver specifically the Kim maneuver utilizing 3 minute position holds. Patient noted to have continued apogeotrophic nystagmus in both the right and left 90 degree head turn positions. However, the  nystagmus was less vigorous and patient reported decreased vertigo as compared to initial evaluation.  Patient reported 3/10 vertigo. Repeated repositioning maneuver.   Provided patient instructions for home and reviewed with patient. Patient instructed to not lie down or tip head up/down for a few hours after treatment session.  Instructed to try to sleep on his right side this evening. Instructed to resume the home exercise program beginning tomorrow. In addition, patient instructed to begin tomorrow trying to lie on his back, roll towards his right and then roll to his left side and try to sleep on his left side for forced prolonged positioning in order to try to get the otoconia moving in the canal.   PT Education - 01/02/18 0900    Education provided  Yes    Person(s) Educated  Patient    Methods  Explanation    Comprehension  Verbalized understanding       PT Short Term Goals - 12/29/17 1526      PT SHORT TERM GOAL #1   Title  Patient will report 50% or greater improvement in his symptoms of dizziness and imbalance with provoking motions or positions.    Time  4    Period  Weeks    Status  New    Target Date  01/26/18        PT Long Term  Goals - 12/29/17 1521      PT LONG TERM GOAL #1   Title  Patient will have demonstrate decreased falls risk as indicated by Activities Specific Balance Confidence Scale score of 80% or greater.    Baseline  Scored 78% on 12/29/17    Time  8    Period  Weeks    Status  New    Target Date  02/23/18      PT LONG TERM GOAL #2   Title  Patient reports no vertigo with provoking motions or positions.    Time  8    Period  Weeks    Status  New    Target Date  02/23/18            Plan - 01/04/18 0902    Clinical Impression Statement  Patient reports that he performed the exercises but was not able to resume sleeping in his bed, but rather slept in a recliner chair. Patient continues to have apogeotrophic nystagmus in both the right  and left supine with head turned to 90 degrees positions. Therefore, attempted horizontal cnaal cupulolith repositioning manuevers this date. Patient provided with additional home instructions. Encouraged patient to follow-up as indicated.      Rehab Potential  Good    Clinical Impairments Affecting Rehab Potential  positive indicators: motivated, responded to therapy in the past  negative indicators: patient has been treated at another PT clinic for 8 weeks without resolution of symptoms    PT Frequency  2x / week    PT Duration  8 weeks    PT Treatment/Interventions  Canalith Repostioning;Neuromuscular re-education;Balance training;Patient/family education;Vestibular    PT Next Visit Plan  repeat attempts to convert horizontal cupulolithiasis to canalithiasis utilizing Kim Manuever with 3 minute holds and vibration and modified Gufoni for cupulolithiasis    PT Home Exercise Plan  modified BD for HC BPPV 10Xs 3 times a day and Forward/backward head pitch repeats 10Xs 3 times a day          Patient will benefit from skilled therapeutic intervention in order to improve the following deficits and impairments:  Dizziness, Decreased balance  Visit Diagnosis: Dizziness and giddiness  BPPV (benign paroxysmal positional vertigo), left     Problem List Patient Active Problem List   Diagnosis Date Noted  . Special screening for malignant neoplasms, colon   . Pain of left calf 08/08/2012  . HYPERLIPIDEMIA TYPE IIB / III 03/02/2010  . CAD, UNSPECIFIED SITE 03/02/2010  . Nonspecific (abnormal) findings on radiological and other examination of body structure 03/01/2010  . CT, CHEST, ABNORMAL 03/01/2010  . HEMORRHOIDS, EXTERNAL 08/01/2008  . ROTATOR CUFF INJURY, RIGHT SHOULDER 08/01/2008   Lady Deutscher PT, DPT [phone] Lady Deutscher 01/02/2018, 9:04 AM  [place] MAIN [facility] SERVICES [address] [place], [place], [address] Phone: [phone]   Fax:   [phone]  Name: PHILLIP MAFFEI MRN: [id number] Date of Birth: Nov 17, 1956

## 2018-01-03 ENCOUNTER — Ambulatory Visit
Admission: RE | Admit: 2018-01-03 | Discharge: 2018-01-03 | Disposition: A | Discharge status: Home / Self Care | Payer: BLUE CROSS/BLUE SHIELD | Source: Ambulatory Visit | Attending: Unknown Physician Specialty | Admitting: Unknown Physician Specialty

## 2018-01-03 DIAGNOSIS — R42 Dizziness and giddiness: Secondary | ICD-10-CM | POA: Diagnosis not present

## 2018-01-03 DIAGNOSIS — I6523 Occlusion and stenosis of bilateral carotid arteries: Secondary | ICD-10-CM | POA: Diagnosis not present

## 2018-01-05 ENCOUNTER — Encounter: Payer: Self-pay | Admitting: Physical Therapy

## 2018-01-05 ENCOUNTER — Ambulatory Visit: Payer: BLUE CROSS/BLUE SHIELD | Admitting: Physical Therapy

## 2018-01-05 DIAGNOSIS — R42 Dizziness and giddiness: Secondary | ICD-10-CM

## 2018-01-05 DIAGNOSIS — H8112 Benign paroxysmal vertigo, left ear: Secondary | ICD-10-CM | POA: Diagnosis not present

## 2018-01-05 NOTE — Therapy (Signed)
[place] MAIN [facility] SERVICES [address] [place], [place], [address] Phone: [phone]   Fax:  [phone]  Physical Therapy Treatment  Patient Details  Name: Eric Vargas MRN: [id number] Date of Birth: 08-Oct-1956 No Data Recorded  Encounter Date: 01/05/2018  PT End of Session - 01/05/18 0902    Visit Number  3    Number of Visits  17    Date for PT Re-Evaluation  02/23/18    PT Start Time  0902    PT Stop Time  0942    PT Time Calculation (min)  40 min    Activity Tolerance  Patient tolerated treatment well    Behavior During Therapy  [facility] for tasks assessed/performed       Past Medical History:  Diagnosis Date  . Abnormal chest CT   . Hemorrhoids, external   . Rotator cuff injury    Right AND LEFT shoulder    Past Surgical History:  Procedure Laterality Date  . COLONOSCOPY WITH PROPOFOL N/A 12/30/2016   Procedure: COLONOSCOPY WITH PROPOFOL;  Surgeon: [name], MD;  Location: [place];  Service: Endoscopy;  Laterality: N/A;  wants to be as early as possible  . Disk repair  1989   At [facility]  . ROTATOR CUFF REPAIR  2000   Left AND RIGHT    There were no vitals filed for this visit.  Subjective Assessment - 01/05/18 0901    Subjective  Patient reports that when laid down Tuesday evening he had brief vertigo. Patient reports on wednesday he felt terrible all day with sensation of drunk feeling. Patient reports that he was able to sleep in his bed last night, but states this morning it took 20 minutes for him "to get settled" this morning. Patient tates he did not have vertigo,but rather had a "sensation of being drunk" this morning.     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  No/denies        Patient reports that when laid down Tuesday evening he had brief vertigo. Patient reports on wednesday he felt terrible  all day with sensation of drunk feeling. Patient reports that he was able to sleep in his bed last night, but states this morning it took 20 minutes for him "to get settled" this morning. Patient tates he did not have vertigo,but rather had a "sensation of being drunk" this morning.  Patient reports that he did some vertical head tips and did a few of the supine head turning exercises but not with the frequency indicated on the HEP instructions. (Reemphasized importance of HEP.)   Repositioning Manuever: Performed left and right horizontal canal head roll test and both were positive for apogeotropic nystagmus after a few seconds latency. Patient rating 0/10 vertigo with left and 1-2/10 vertigo with right testing.  On mat table, performed left horizontal canal cupulolith repositioning maneuver specifically the Kim maneuver utilizing 3 minute position holds and vibration in positions 2 and 5. Patient noted to have continued apogeotrophic nystagmus in both the right and left 90 degree head turn positions. However, the nystagmus was less vigorous and patient reported decreased vertigo as compared to prior sessions. Patient reported 1-2/10 vertigo with right 90 degree head turn position and 0/10 vertigo with left head turn position.  Then, performed modified Gufoni maneuver for left apogeotrophic HC BPPV with 3 minute hold  once nystagmus subsided. Patient given modified Gufoni for home exercise program and written instructions provided. Patient to try to perform a minimum of 2 times per day.    PT Education - 01/05/18 0902    Education provided  Yes    Education Details  Educated as to [place] for apogeotrophic [facility] BPPV for home exercise program and reinforced HEP and instructions from last visit    Person(s) Educated  Patient    Methods  Explanation;Handout    Comprehension  Verbalized understanding       PT Short Term Goals - 12/29/17 1526      PT SHORT TERM GOAL #1   Title  Patient will report 50%  or greater improvement in his symptoms of dizziness and imbalance with provoking motions or positions.    Time  4    Period  Weeks    Status  New    Target Date  01/26/18        PT Long Term Goals - 12/29/17 1521      PT LONG TERM GOAL #1   Title  Patient will have demonstrate decreased falls risk as indicated by Activities Specific Balance Confidence Scale score of 80% or greater.    Baseline  Scored 78% on 12/29/17    Time  8    Period  Weeks    Status  New    Target Date  02/23/18      PT LONG TERM GOAL #2   Title  Patient reports no vertigo with provoking motions or positions.    Time  8    Period  Weeks    Status  New    Target Date  02/23/18            Plan - 01/05/18 0902    Clinical Impression Statement  Patient with decreased intensity rating of vertigo and noted decreased intensity of nystagmus noted with left and right head roll test. Patient was able to sleep in his bed for the first time in several weeks. Patient educated as to how to perform modified Gufoni maneuver for apogeotrophic [facility] BPPV this date and issued for HEP. Patient would benefit from continued PT services to further address symptoms and goals.      Rehab Potential  Good    Clinical Impairments Affecting Rehab Potential  positive indicators: motivated, responded to therapy in the past  negative indicators: patient has been treated at another PT clinic for 8 weeks without resolution of symptoms    PT Frequency  2x / week    PT Duration  8 weeks    PT Treatment/Interventions  Canalith Repostioning;Neuromuscular re-education;Balance training;Patient/family education;Vestibular    PT Next Visit Plan  repeat attempts to convert horizontal cupulolithiasis to canalithiasis utilizing Kim Manuever with 3 minute holds and vibration and modified Gufoni for cupulolithiasis    PT Home Exercise Plan  modified BD for HC BPPV 10Xs 3 times a day and Forward/backward head pitch repeats 10Xs 3 times a day           Patient will benefit from skilled therapeutic intervention in order to improve the following deficits and impairments:  Dizziness, Decreased balance  Visit Diagnosis: Dizziness and giddiness  BPPV (benign paroxysmal positional vertigo), left     Problem List Patient Active Problem List   Diagnosis Date Noted  . Special screening for malignant neoplasms, colon   . Pain of left calf 08/08/2012  . HYPERLIPIDEMIA TYPE IIB / III 03/02/2010  . CAD, UNSPECIFIED SITE 03/02/2010  .  Nonspecific (abnormal) findings on radiological and other examination of body structure 03/01/2010  . CT, CHEST, ABNORMAL 03/01/2010  . HEMORRHOIDS, EXTERNAL 08/01/2008  . ROTATOR CUFF INJURY, RIGHT SHOULDER 08/01/2008   Lady Deutscher PT, DPT [phone] Lady Deutscher 01/05/2018, 10:32 AM  [place] MAIN [facility] SERVICES [address] [place], [place], [address] Phone: [phone]   Fax:  [phone]  Name: Eric Vargas MRN: [id number] Date of Birth: 1956-08-23

## 2018-01-09 ENCOUNTER — Encounter: Payer: Self-pay | Admitting: Physical Therapy

## 2018-01-09 ENCOUNTER — Ambulatory Visit: Payer: BLUE CROSS/BLUE SHIELD | Admitting: Physical Therapy

## 2018-01-09 DIAGNOSIS — R42 Dizziness and giddiness: Secondary | ICD-10-CM

## 2018-01-09 DIAGNOSIS — H8112 Benign paroxysmal vertigo, left ear: Secondary | ICD-10-CM

## 2018-01-09 NOTE — Therapy (Signed)
[place] MAIN [facility] SERVICES [address] [place], [place], [address] Phone: [phone]   Fax:  [phone]  Physical Therapy Treatment  Patient Details  Name: Eric Vargas MRN: [id number] Date of Birth: August 12, 1956 No Data Recorded  Encounter Date: 01/09/2018  PT End of Session - 01/09/18 1523    Visit Number  4    Number of Visits  17    Date for PT Re-Evaluation  02/23/18    PT Start Time  0904    PT Stop Time  0945    PT Time Calculation (min)  41 min    Activity Tolerance  Patient tolerated treatment well    Behavior During Therapy  [facility] for tasks assessed/performed       Past Medical History:  Diagnosis Date  . Abnormal chest CT   . Hemorrhoids, external   . Rotator cuff injury    Right AND LEFT shoulder    Past Surgical History:  Procedure Laterality Date  . COLONOSCOPY WITH PROPOFOL N/A 12/30/2016   Procedure: COLONOSCOPY WITH PROPOFOL;  Surgeon: [name], MD;  Location: [place];  Service: Endoscopy;  Laterality: N/A;  wants to be as early as possible  . Disk repair  1989   At [facility]  . ROTATOR CUFF REPAIR  2000   Left AND RIGHT    There were no vitals filed for this visit.  Subjective Assessment - 01/09/18 0905    Subjective  Patient reports that when he does the home BPPV maneuver exercise, he is still getting mild symptoms. Patient reports the worst time is when he gets up at night to use the restroom, but states the dizziness only lasts about 1 minute. Patient states that he has been able to lie on his right side consistently and sleep in his bed instead of the recliner chair now. Patient states that he was able to chop wood yesterday without symptoms as well.     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  No/denies         Repositioning Manuever: On mat table, performed left horizontal canal  cupulolith repositioning maneuver specifically the Kim maneuver utilizing 3 minute position holds and vibration in positions 2 and 5.  positive for apogeotropic nystagmus after a few seconds latency. Patient rating 0/10 vertigo with left head rotation 45 degrees and 1/10 vertigo with right head rotation. Noted nystagmus was less vigorous as well as patient less symptomatic this date.   Reviewed modified Gufoni for home exercise program  Then, performed modified Gufoni maneuver for left apogeotrophic HC BPPV with 2 minute hold once nystagmus subsided.   Reviewed instructions to sleep on right side as able.     PT Education - 01/09/18 1522    Education provided  Yes    Education Details  Reviewed Gufoni home exercise     Person(s) Educated  Patient    Methods  Explanation    Comprehension  Verbalized understanding;Returned demonstration       PT Short Term Goals - 12/29/17 1526      PT SHORT TERM GOAL #1   Title  Patient will report 50% or greater improvement in his symptoms of dizziness and imbalance with provoking motions or positions.    Time  4    Period  Weeks    Status  New    Target Date  01/26/18        PT Long Term Goals - 12/29/17 1521      PT LONG TERM GOAL #1   Title  Patient will have demonstrate decreased falls risk as indicated by Activities Specific Balance Confidence Scale score of 80% or greater.    Baseline  Scored 78% on 12/29/17    Time  8    Period  Weeks    Status  New    Target Date  02/23/18      PT LONG TERM GOAL #2   Title  Patient reports no vertigo with provoking motions or positions.    Time  8    Period  Weeks    Status  New    Target Date  02/23/18            Plan - 01/09/18 1524    Clinical Impression Statement  Although patient continues to demonstrate apogeotrophic nystagmus with testing this date, he is reporting improvement in his symptoms overall and noted decrease in nysatgmus speed and vigourousness during testing. In  addition, patient reported 1/10 vertigo during session this date which is an improvement from last session. Patient reporting improved compliance with home exercise program and states that he has been able to return to sleeping in his bed now. Patient would benefit from PT services to further address goals and symptoms.      Rehab Potential  Good    Clinical Impairments Affecting Rehab Potential  positive indicators: motivated, responded to therapy in the past  negative indicators: patient has been treated at another PT clinic for 8 weeks without resolution of symptoms    PT Frequency  2x / week    PT Duration  8 weeks    PT Treatment/Interventions  Canalith Repostioning;Neuromuscular re-education;Balance training;Patient/family education;Vestibular    PT Next Visit Plan  repeat attempts to convert horizontal cupulolithiasis to canalithiasis utilizing Kim Manuever with 3 minute holds and vibration and modified Gufoni for cupulolithiasis    PT Home Exercise Plan  modified BD for HC BPPV 10Xs 3 times a day and Forward/backward head pitch repeats 10Xs 3 times a day          Patient will benefit from skilled therapeutic intervention in order to improve the following deficits and impairments:  Dizziness, Decreased balance  Visit Diagnosis: BPPV (benign paroxysmal positional vertigo), left  Dizziness and giddiness     Problem List Patient Active Problem List   Diagnosis Date Noted  . Special screening for malignant neoplasms, colon   . Pain of left calf 08/08/2012  . HYPERLIPIDEMIA TYPE IIB / III 03/02/2010  . CAD, UNSPECIFIED SITE 03/02/2010  . Nonspecific (abnormal) findings on radiological and other examination of body structure 03/01/2010  . CT, CHEST, ABNORMAL 03/01/2010  . HEMORRHOIDS, EXTERNAL 08/01/2008  . ROTATOR CUFF INJURY, RIGHT SHOULDER 08/01/2008   Lady Deutscher PT, DPT [phone] Lady Deutscher 01/09/2018, 4:27 PM  [place] MAIN [facility]  SERVICES [address] [place], [place], [address] Phone: [phone]   Fax:  [phone]  Name: Eric Vargas MRN: [id number] Date of Birth: [birth year]

## 2018-01-12 ENCOUNTER — Ambulatory Visit: Payer: BLUE CROSS/BLUE SHIELD

## 2018-01-15 ENCOUNTER — Ambulatory Visit
Admission: RE | Admit: 2018-01-15 | Discharge: 2018-01-15 | Disposition: A | Discharge status: Home / Self Care | Payer: BLUE CROSS/BLUE SHIELD | Source: Ambulatory Visit | Attending: Unknown Physician Specialty | Admitting: Unknown Physician Specialty

## 2018-01-15 ENCOUNTER — Ambulatory Visit: Payer: BLUE CROSS/BLUE SHIELD | Admitting: Physical Therapy

## 2018-01-15 DIAGNOSIS — R42 Dizziness and giddiness: Secondary | ICD-10-CM | POA: Insufficient documentation

## 2018-01-15 DIAGNOSIS — J322 Chronic ethmoidal sinusitis: Secondary | ICD-10-CM | POA: Insufficient documentation

## 2018-01-15 DIAGNOSIS — J323 Chronic sphenoidal sinusitis: Secondary | ICD-10-CM | POA: Insufficient documentation

## 2018-01-15 NOTE — Progress Notes (Signed)
This encounter was created in error - please disregard.

## 2018-01-16 ENCOUNTER — Ambulatory Visit: Payer: BLUE CROSS/BLUE SHIELD | Admitting: Physical Therapy

## 2018-01-16 ENCOUNTER — Encounter: Payer: Self-pay | Admitting: Physical Therapy

## 2018-01-16 ENCOUNTER — Encounter: Payer: BLUE CROSS/BLUE SHIELD | Admitting: Physical Therapy

## 2018-01-16 DIAGNOSIS — R42 Dizziness and giddiness: Secondary | ICD-10-CM | POA: Diagnosis not present

## 2018-01-16 DIAGNOSIS — H8112 Benign paroxysmal vertigo, left ear: Secondary | ICD-10-CM

## 2018-01-16 NOTE — Therapy (Signed)
[place] MAIN [facility] SERVICES [address] [place], [place], [address] Phone: [phone]   Fax:  [phone]  Physical Therapy Treatment  Patient Details  Name: Eric Vargas MRN: [id number] Date of Birth: 07-Apr-1956 No Data Recorded  Encounter Date: 01/16/2018  PT End of Session - 01/16/18 1047    Visit Number  5    Number of Visits  17    Date for PT Re-Evaluation  02/23/18    PT Start Time  0758    PT Stop Time  0844    PT Time Calculation (min)  46 min    Activity Tolerance  Patient tolerated treatment well    Behavior During Therapy  [facility] for tasks assessed/performed       Past Medical History:  Diagnosis Date  . Abnormal chest CT   . Hemorrhoids, external   . Rotator cuff injury    Right AND LEFT shoulder    Past Surgical History:  Procedure Laterality Date  . COLONOSCOPY WITH PROPOFOL N/A 12/30/2016   Procedure: COLONOSCOPY WITH PROPOFOL;  Surgeon: [name], MD;  Location: [place];  Service: Endoscopy;  Laterality: N/A;  wants to be as early as possible  . Disk repair  1989   At [facility]  . ROTATOR CUFF REPAIR  2000   Left AND RIGHT    There were no vitals filed for this visit.  Subjective Assessment - 01/16/18 0756    Subjective  Patient states he feels his symptoms are getting better. Patient states he is consistently able to sleep in his bed now. Patient states when he goes to get up to use the restroom at night, he is not having vertigo issues. Patient states he has been getting some vertigo with getting up in the morning, but it is much improved and does not last long. Patient reports that he has been able to do yard work.      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  No/denies       Repositioning Manuever: Performed left head roll test and patient noted to have 4-5 beats of slow beating apogeotrophic  nystagmus after a few seconds latency. Patient denied vertigo but states he could tell that he was experiencing the nystagmus because his vision changed. The nystagmus was significantly less vigorous and patient reported decreased vertigo as compared to prior sessions.    On mat table, performed HC-BPPV repositioning maneuver specifically the Francisco maneuver utilizing 3 minute position holds and added vibration in position number three. Then, performed left horizontal canal cupulolith repositioning maneuver specifically the Kim maneuver utilizing 3 minute position holds and vibration in positions 2 and 5.   Patient reports that he has continued to perform the modified Gufoni for home exercise program and has been able to sleep on his uninvolved right side.    PT Education - 01/16/18 1046    Education provided  Yes    Education Details  Discussed Lozano BPPV; reviewed Gufoni home exercise and HEP instructions    Person(s) Educated  Patient    Methods  Explanation    Comprehension  Verbalized understanding       PT Short Term Goals - 12/29/17 1526      PT SHORT TERM GOAL #1   Title  Patient will report 50% or greater improvement in his symptoms of dizziness and imbalance with provoking  motions or positions.    Time  4    Period  Weeks    Status  New    Target Date  01/26/18        PT Long Term Goals - 12/29/17 1521      PT LONG TERM GOAL #1   Title  Patient will have demonstrate decreased falls risk as indicated by Activities Specific Balance Confidence Scale score of 80% or greater.    Baseline  Scored 78% on 12/29/17    Time  8    Period  Weeks    Status  New    Target Date  02/23/18      PT LONG TERM GOAL #2   Title  Patient reports no vertigo with provoking motions or positions.    Time  8    Period  Weeks    Status  New    Target Date  02/23/18            Plan - 01/16/18 1048    Clinical Impression Statement  Patient noted to have ageotrophic nystagmus with  head roll test this date however, the nystagmus was significantly reduced in length of time present as well as with vigourousness observing only 4-5 beats with left head roll this date. Patient continues to report improvement of his symptoms and compliance with HEP. Patient states he has been consistently able to sleep in his bed now as initially after his onset of symptoms he was not able to lie flat and slept in a recliner chair for many weeks. Patient to return to clinic in one week to retest head roll test and perform canalith repositioning manuevers if indicated until clearance or resolution of his symptoms.     Rehab Potential  Good    Clinical Impairments Affecting Rehab Potential  positive indicators: motivated, responded to therapy in the past  negative indicators: patient has been treated at another PT clinic for 8 weeks without resolution of symptoms    PT Frequency  2x / week    PT Duration  8 weeks    PT Treatment/Interventions  Canalith Repostioning;Neuromuscular re-education;Balance training;Patient/family education;Vestibular    PT Next Visit Plan  repeat attempts to convert horizontal cupulolithiasis to canalithiasis utilizing Kim Manuever with 3 minute holds and vibration and modified Gufoni for cupulolithiasis;     PT Home Exercise Plan  modified BD for HC BPPV 10Xs 3 times a day and Forward/backward head pitch repeats 10Xs 3 times a day          Patient will benefit from skilled therapeutic intervention in order to improve the following deficits and impairments:  Dizziness, Decreased balance  Visit Diagnosis: BPPV (benign paroxysmal positional vertigo), left  Dizziness and giddiness     Problem List Patient Active Problem List   Diagnosis Date Noted  . Special screening for malignant neoplasms, colon   . Pain of left calf 08/08/2012  . HYPERLIPIDEMIA TYPE IIB / III 03/02/2010  . CAD, UNSPECIFIED SITE 03/02/2010  . Nonspecific (abnormal) findings on radiological and  other examination of body structure 03/01/2010  . CT, CHEST, ABNORMAL 03/01/2010  . HEMORRHOIDS, EXTERNAL 08/01/2008  . ROTATOR CUFF INJURY, RIGHT SHOULDER 08/01/2008   Lady Deutscher PT, DPT [phone] Lady Deutscher 01/16/2018, 11:03 AM  [place] MAIN [facility] SERVICES [address] [place], [place], [address] Phone: [phone]   Fax:  [phone]  Name: Eric Vargas MRN: [id number] Date of Birth: [birth year]

## 2018-01-19 ENCOUNTER — Ambulatory Visit: Payer: BLUE CROSS/BLUE SHIELD | Admitting: Physical Therapy

## 2018-01-23 ENCOUNTER — Ambulatory Visit: Payer: BLUE CROSS/BLUE SHIELD | Attending: Unknown Physician Specialty

## 2018-01-23 VITALS — BP 146/90 | HR 78

## 2018-01-23 DIAGNOSIS — H8112 Benign paroxysmal vertigo, left ear: Secondary | ICD-10-CM | POA: Diagnosis not present

## 2018-01-23 DIAGNOSIS — R42 Dizziness and giddiness: Secondary | ICD-10-CM | POA: Insufficient documentation

## 2018-01-23 NOTE — Therapy (Signed)
[name] [facility] SERVICES [address] [place], [place], [address] Phone: [phone]   Fax:  [phone]  Physical Therapy Treatment  Patient Details  Name: Eric Vargas MRN: [id number] Date of Birth: 10/12/1956 No Data Recorded  Encounter Date: 01/23/2018  PT End of Session - 01/23/18 0858    Visit Number  6    Number of Visits  17    Date for PT Re-Evaluation  02/23/18    PT Start Time  0900    PT Stop Time  0945    PT Time Calculation (min)  45 min    Activity Tolerance  Patient tolerated treatment well    Behavior During Therapy  Eric Vargas for tasks assessed/performed       Past Medical History:  Diagnosis Date  . Abnormal chest CT   . Hemorrhoids, external   . Rotator cuff injury    Right AND LEFT shoulder    Past Surgical History:  Procedure Laterality Date  . COLONOSCOPY WITH PROPOFOL N/A 12/30/2016   Procedure: COLONOSCOPY WITH PROPOFOL;  Surgeon: Eric Lame, MD;  Location: [place];  Service: Endoscopy;  Laterality: N/A;  wants to be as early as possible  . Disk repair  1989   At [facility]  . ROTATOR CUFF REPAIR  2000   Left AND RIGHT    Vitals:   01/23/18 0903  BP: (!) 146/90  Pulse: 78  SpO2: 100%    Subjective Assessment - 01/23/18 0858    Subjective  Pt reports that he is doing well today. He reports continued vertigo especially when laying on the right side. HEP is going well. He has been able to sleep on the right side. He reports vertigo for about 1 minute when laying on the right side. No other changes in health. No specific questions or concerns.     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  No/denies           TREATMENT  Repositioning Manuever: Performed left head roll test and patient noted to have consistent slow beating apogeotrophic nystagmus after a few seconds latency for a total of  approximately 90 seconds. Patient denied vertigo but states he could tell that he was experiencing the nystagmus because his vision changed. Pt was able to suppress the nystagmus response with gaze fixation. Performed right head roll test. Patient noted to have consistent slow beating apogeotrophic nystagmus after a few seconds latency for a total of approximately 75-90 seconds. Nystagmus is more vigorous with R head roll. Pt reports 2-3/10 vertigo as well.   On mat table,performed L HC-BPPV repositioning maneuver, specifically the Francisco maneuver, utilizing 3 minute position holds and added vibration in position number three. Then, performed left horizontal canal cupulolith repositioning maneuver, specifically the Kim maneuver, utilizing 3 minute position holds and vibration in positions 2 and 5.   Patient reports that he has continued to perform the modified Gufoni for home exercise program and has been able to sleep on his uninvolved right side.                     PT Education - 01/23/18 0858    Education provided  Yes    Education Details  Reviewed prognosis and response to repositioning maneuvers, reviewed HEP    Person(s) Educated  Patient    Methods  Explanation  Comprehension  Verbalized understanding       PT Short Term Goals - 12/29/17 1526      PT SHORT TERM GOAL #1   Title  Patient will report 50% or greater improvement in his symptoms of dizziness and imbalance with provoking motions or positions.    Time  4    Period  Weeks    Status  New    Target Date  01/26/18        PT Long Term Goals - 12/29/17 1521      PT LONG TERM GOAL #1   Title  Patient will have demonstrate decreased falls risk as indicated by Activities Specific Balance Confidence Scale score of 80% or greater.    Baseline  Scored 78% on 12/29/17    Time  8    Period  Weeks    Status  New    Target Date  02/23/18      PT LONG TERM GOAL #2   Title  Patient reports no vertigo with  provoking motions or positions.    Time  8    Period  Weeks    Status  New    Target Date  02/23/18            Plan - 01/23/18 0859    Clinical Impression Statement  Pt with persistent ageotrophic nystagmus with head roll test on this date. He reports compliance with HEP and gradual improvement in his symtpoms. He reports that he continues to sleep on his R side. Pt encouraged to continue HEP and follow-up in 1 week for retesting and repeate repositioning manuevers if indicated until his symptoms have completely resolved.     Clinical Presentation  Evolving    Rehab Potential  Good    Clinical Impairments Affecting Rehab Potential  positive indicators: motivated, responded to therapy in the past  negative indicators: patient has been treated at another PT clinic for 8 weeks without resolution of symptoms    PT Frequency  2x / week    PT Duration  8 weeks    PT Treatment/Interventions  Canalith Repostioning;Neuromuscular re-education;Balance training;Patient/family education;Vestibular    PT Next Visit Plan  repeat attempts to convert horizontal cupulolithiasis to canalithiasis utilizing Kim Manuever with 3 minute holds and vibration and modified Gufoni for cupulolithiasis;     PT Home Exercise Plan  modified BD for HC BPPV 10Xs 3 times a day and Forward/backward head pitch repeats 10Xs 3 times a day          Patient will benefit from skilled therapeutic intervention in order to improve the following deficits and impairments:  Dizziness, Decreased balance  Visit Diagnosis: BPPV (benign paroxysmal positional vertigo), left  Dizziness and giddiness     Problem List Patient Active Problem List   Diagnosis Date Noted  . Special screening for malignant neoplasms, colon   . Pain of left calf 08/08/2012  . HYPERLIPIDEMIA TYPE IIB / III 03/02/2010  . CAD, UNSPECIFIED SITE 03/02/2010  . Nonspecific (abnormal) findings on radiological and other examination of body structure 03/01/2010   . CT, CHEST, ABNORMAL 03/01/2010  . HEMORRHOIDS, EXTERNAL 08/01/2008  . ROTATOR CUFF INJURY, RIGHT SHOULDER 08/01/2008   Eric Vargas PT, DPT   Eric Vargas 01/23/2018, 10:06 AM  [place] MAIN [facility] SERVICES [address] [place], [place], [address] Phone: [phone]   Fax:  [phone]  Name: Eric Vargas MRN: [id number] Date of Birth: [birth year]

## 2018-01-24 DIAGNOSIS — H8110 Benign paroxysmal vertigo, unspecified ear: Secondary | ICD-10-CM | POA: Diagnosis not present

## 2018-01-30 ENCOUNTER — Ambulatory Visit: Payer: BLUE CROSS/BLUE SHIELD | Admitting: Physical Therapy

## 2018-02-02 ENCOUNTER — Ambulatory Visit: Payer: BLUE CROSS/BLUE SHIELD | Admitting: Physical Therapy

## 2018-02-06 ENCOUNTER — Ambulatory Visit: Payer: BLUE CROSS/BLUE SHIELD | Admitting: Physical Therapy

## 2018-02-13 ENCOUNTER — Ambulatory Visit: Payer: BLUE CROSS/BLUE SHIELD | Admitting: Physical Therapy

## 2018-03-05 DIAGNOSIS — R5383 Other fatigue: Secondary | ICD-10-CM | POA: Diagnosis not present

## 2018-04-24 DIAGNOSIS — M9903 Segmental and somatic dysfunction of lumbar region: Secondary | ICD-10-CM | POA: Diagnosis not present

## 2018-04-24 DIAGNOSIS — G5722 Lesion of femoral nerve, left lower limb: Secondary | ICD-10-CM | POA: Diagnosis not present

## 2018-04-24 DIAGNOSIS — M5386 Other specified dorsopathies, lumbar region: Secondary | ICD-10-CM | POA: Diagnosis not present

## 2018-05-02 DIAGNOSIS — M5386 Other specified dorsopathies, lumbar region: Secondary | ICD-10-CM | POA: Diagnosis not present

## 2018-05-02 DIAGNOSIS — G5722 Lesion of femoral nerve, left lower limb: Secondary | ICD-10-CM | POA: Diagnosis not present

## 2018-05-02 DIAGNOSIS — M9903 Segmental and somatic dysfunction of lumbar region: Secondary | ICD-10-CM | POA: Diagnosis not present

## 2018-05-02 DIAGNOSIS — M531 Cervicobrachial syndrome: Secondary | ICD-10-CM | POA: Diagnosis not present

## 2018-05-08 ENCOUNTER — Other Ambulatory Visit: Payer: Self-pay | Admitting: Unknown Physician Specialty

## 2018-05-08 DIAGNOSIS — M5430 Sciatica, unspecified side: Secondary | ICD-10-CM

## 2018-05-08 DIAGNOSIS — M9903 Segmental and somatic dysfunction of lumbar region: Secondary | ICD-10-CM | POA: Diagnosis not present

## 2018-05-08 DIAGNOSIS — M5386 Other specified dorsopathies, lumbar region: Secondary | ICD-10-CM | POA: Diagnosis not present

## 2018-05-08 DIAGNOSIS — M531 Cervicobrachial syndrome: Secondary | ICD-10-CM | POA: Diagnosis not present

## 2018-05-08 DIAGNOSIS — G5722 Lesion of femoral nerve, left lower limb: Secondary | ICD-10-CM | POA: Diagnosis not present

## 2018-05-09 ENCOUNTER — Encounter: Payer: Self-pay | Admitting: *Deleted

## 2018-05-09 ENCOUNTER — Other Ambulatory Visit: Payer: Self-pay

## 2018-05-09 DIAGNOSIS — R221 Localized swelling, mass and lump, neck: Secondary | ICD-10-CM | POA: Diagnosis not present

## 2018-05-09 DIAGNOSIS — M792 Neuralgia and neuritis, unspecified: Secondary | ICD-10-CM | POA: Diagnosis not present

## 2018-05-10 NOTE — Discharge Instructions (Signed)
General Anesthesia, Adult, Care After °These instructions provide you with information about caring for yourself after your procedure. Your health care provider may also give you more specific instructions. Your treatment has been planned according to current medical practices, but problems sometimes occur. Call your health care provider if you have any problems or questions after your procedure. °What can I expect after the procedure? °After the procedure, it is common to have: °· Vomiting. °· A sore throat. °· Mental slowness. ° °It is common to feel: °· Nauseous. °· Cold or shivery. °· Sleepy. °· Tired. °· Sore or achy, even in parts of your body where you did not have surgery. ° °Follow these instructions at home: °For at least 24 hours after the procedure: °· Do not: °? Participate in activities where you could fall or become injured. °? Drive. °? Use heavy machinery. °? Drink alcohol. °? Take sleeping pills or medicines that cause drowsiness. °? Make important decisions or sign legal documents. °? Take care of children on your own. °· Rest. °Eating and drinking °· If you vomit, drink water, juice, or soup when you can drink without vomiting. °· Drink enough fluid to keep your urine clear or pale yellow. °· Make sure you have little or no nausea before eating solid foods. °· Follow the diet recommended by your health care provider. °General instructions °· Have a responsible adult stay with you until you are awake and alert. °· Return to your normal activities as told by your health care provider. Ask your health care provider what activities are safe for you. °· Take over-the-counter and prescription medicines only as told by your health care provider. °· If you smoke, do not smoke without supervision. °· Keep all follow-up visits as told by your health care provider. This is important. °Contact a health care provider if: °· You continue to have nausea or vomiting at home, and medicines are not helpful. °· You  cannot drink fluids or start eating again. °· You cannot urinate after 8-12 hours. °· You develop a skin rash. °· You have fever. °· You have increasing redness at the site of your procedure. °Get help right away if: °· You have difficulty breathing. °· You have chest pain. °· You have unexpected bleeding. °· You feel that you are having a life-threatening or urgent problem. °This information is not intended to replace advice given to you by your health care provider. Make sure you discuss any questions you have with your health care provider. °Document Released: 02/13/2001 Document Revised: 04/11/2016 Document Reviewed: 10/22/2015 °Elsevier Interactive Patient Education © 2018 [employer] ° °

## 2018-05-11 ENCOUNTER — Ambulatory Visit: Payer: BLUE CROSS/BLUE SHIELD | Admitting: Anesthesiology

## 2018-05-11 ENCOUNTER — Encounter
Admission: RE | Disposition: A | Discharge status: Home / Self Care | Payer: Self-pay | Source: Ambulatory Visit | Attending: Unknown Physician Specialty

## 2018-05-11 ENCOUNTER — Ambulatory Visit
Admission: RE | Admit: 2018-05-11 | Discharge: 2018-05-11 | Disposition: A | Discharge status: Home / Self Care | Payer: BLUE CROSS/BLUE SHIELD | Source: Ambulatory Visit | Attending: Unknown Physician Specialty | Admitting: Unknown Physician Specialty

## 2018-05-11 DIAGNOSIS — Z6832 Body mass index (BMI) 32.0-32.9, adult: Secondary | ICD-10-CM | POA: Diagnosis not present

## 2018-05-11 DIAGNOSIS — D170 Benign lipomatous neoplasm of skin and subcutaneous tissue of head, face and neck: Secondary | ICD-10-CM | POA: Insufficient documentation

## 2018-05-11 DIAGNOSIS — R221 Localized swelling, mass and lump, neck: Secondary | ICD-10-CM | POA: Diagnosis not present

## 2018-05-11 HISTORY — DX: Nausea with vomiting, unspecified: R11.2

## 2018-05-11 HISTORY — DX: Other specified postprocedural states: Z98.890

## 2018-05-11 HISTORY — PX: EXCISION MASS NECK: SHX6703

## 2018-05-11 SURGERY — EXCISION, MASS, NECK
Anesthesia: General | Site: Neck | Laterality: Right | Wound class: "Clean "

## 2018-05-11 MED ORDER — LACTATED RINGERS IV SOLN
INTRAVENOUS | Status: DC
  Administered 2018-05-11: 08:00:00 via INTRAVENOUS

## 2018-05-11 MED ORDER — MIDAZOLAM HCL 5 MG/5ML IJ SOLN
INTRAMUSCULAR | Status: DC | PRN
  Administered 2018-05-11: 2 mg via INTRAVENOUS

## 2018-05-11 MED ORDER — LIDOCAINE HCL (CARDIAC) PF 100 MG/5ML IV SOSY
PREFILLED_SYRINGE | INTRAVENOUS | Status: DC | PRN
  Administered 2018-05-11: 40 mg via INTRATRACHEAL

## 2018-05-11 MED ORDER — LIDOCAINE-EPINEPHRINE 1 %-1:100000 IJ SOLN
INTRAMUSCULAR | Status: DC | PRN
  Administered 2018-05-11: 3 mL

## 2018-05-11 MED ORDER — OXYCODONE-ACETAMINOPHEN 10-325 MG PO TABS: 1.0000 | ORAL_TABLET | Freq: Four times a day (QID) | ORAL | 0 refills | Status: DC | PRN

## 2018-05-11 MED ORDER — CEPHALEXIN 500 MG PO CAPS: 500.0000 mg | ORAL_CAPSULE | Freq: Two times a day (BID) | ORAL | 0 refills | Status: DC

## 2018-05-11 MED ORDER — DEXAMETHASONE SODIUM PHOSPHATE 4 MG/ML IJ SOLN
INTRAMUSCULAR | Status: DC | PRN
  Administered 2018-05-11: 8 mg via INTRAVENOUS

## 2018-05-11 MED ORDER — SCOPOLAMINE 1 MG/3DAYS TD PT72
1.0000 | MEDICATED_PATCH | TRANSDERMAL | Status: DC
  Administered 2018-05-11: 1.5 mg via TRANSDERMAL

## 2018-05-11 MED ORDER — LACTATED RINGERS IV SOLN
1000.0000 mL | INTRAVENOUS | Status: DC
  Administered 2018-05-11: 11:00:00 via INTRAVENOUS

## 2018-05-11 MED ORDER — OXYCODONE HCL 5 MG PO TABS: 5.0000 mg | ORAL_TABLET | Freq: Once | ORAL | Status: DC | PRN

## 2018-05-11 MED ORDER — FENTANYL CITRATE (PF) 100 MCG/2ML IJ SOLN
INTRAMUSCULAR | Status: DC | PRN
  Administered 2018-05-11: 50 ug via INTRAVENOUS

## 2018-05-11 MED ORDER — BUPIVACAINE HCL (PF) 0.5 % IJ SOLN
INTRAMUSCULAR | Status: DC | PRN
  Administered 2018-05-11: 4 mL

## 2018-05-11 MED ORDER — ONDANSETRON HCL 4 MG/2ML IJ SOLN
INTRAMUSCULAR | Status: DC | PRN
  Administered 2018-05-11: 4 mg via INTRAVENOUS

## 2018-05-11 MED ORDER — OXYCODONE HCL 5 MG/5ML PO SOLN: 5.0000 mg | Freq: Once | ORAL | Status: DC | PRN

## 2018-05-11 MED ORDER — GLYCOPYRROLATE 0.2 MG/ML IJ SOLN
INTRAMUSCULAR | Status: DC | PRN
  Administered 2018-05-11: 0.1 mg via INTRAVENOUS

## 2018-05-11 MED ORDER — PROPOFOL 10 MG/ML IV BOLUS
INTRAVENOUS | Status: DC | PRN
  Administered 2018-05-11: 150 mg via INTRAVENOUS

## 2018-05-11 SURGICAL SUPPLY — 16 items
"PENCIL ELECTRO HAND CTR " (MISCELLANEOUS) IMPLANT
DERMABOND ADVANCED (GAUZE/BANDAGES/DRESSINGS) ×2
DERMABOND ADVANCED .7 DNX12 (GAUZE/BANDAGES/DRESSINGS) IMPLANT
ELECT CAUTERY BLADE TIP 2.5 (TIP) ×3
ELECT REM PT RETURN 9FT ADLT (ELECTROSURGICAL) ×3
ELECTRODE CAUTERY BLDE TIP 2.5 (TIP) IMPLANT
ELECTRODE REM PT RTRN 9FT ADLT (ELECTROSURGICAL) IMPLANT
GLOVE BIO SURGEON STRL SZ7.5 (GLOVE) ×4 IMPLANT
NDL HYPO 25GX1X1/2 BEV (NEEDLE) IMPLANT
NEEDLE HYPO 25GX1X1/2 BEV (NEEDLE) ×3 IMPLANT
NS IRRIG 500ML POUR BTL (IV SOLUTION) ×2 IMPLANT
PACK DRAPE NASAL/ENT (PACKS) ×2 IMPLANT
PENCIL ELECTRO HAND CTR (MISCELLANEOUS) ×3 IMPLANT
SPONGE KITTNER 5P (MISCELLANEOUS) ×2 IMPLANT
SPONGE NEURO XRAY DETECT 1X3 (DISPOSABLE) ×2 IMPLANT
SYR 10ML LL (SYRINGE) ×2 IMPLANT

## 2018-05-11 NOTE — Transfer of Care (Signed)
Immediate Anesthesia Transfer of Care Note  Patient: Eric Vargas  Procedure(s) Performed: EXCISION MASS NECK (Right Neck)  Patient Location: PACU  Anesthesia Type: General ETT  Level of Consciousness: awake, alert  and patient cooperative  Airway and Oxygen Therapy: Patient Spontanous Breathing and Patient connected to supplemental oxygen  Post-op Assessment: Post-op Vital signs reviewed, Patient's Cardiovascular Status Stable, Respiratory Function Stable, Patent Airway and No signs of Nausea or vomiting  Post-op Vital Signs: Reviewed and stable  Complications: No apparent anesthesia complications

## 2018-05-11 NOTE — H&P (Signed)
The patient's history has been reviewed, patient examined, no change in status, stable for surgery.  Questions were answered to the patients satisfaction.  

## 2018-05-11 NOTE — Anesthesia Postprocedure Evaluation (Signed)
Anesthesia Post Note  Patient: Eric Vargas  Procedure(s) Performed: EXCISION MASS NECK (Right Neck)  Patient location during evaluation: PACU Anesthesia Type: General Level of consciousness: awake and alert Pain management: pain level controlled Vital Signs Assessment: post-procedure vital signs reviewed and stable Respiratory status: spontaneous breathing, nonlabored ventilation, respiratory function stable and patient connected to nasal cannula oxygen Cardiovascular status: blood pressure returned to baseline and stable Postop Assessment: no apparent nausea or vomiting Anesthetic complications: no    Jahzion Brogden

## 2018-05-11 NOTE — Anesthesia Preprocedure Evaluation (Signed)
Anesthesia Evaluation  Patient identified by MRN, date of birth, ID band  Reviewed: NPO status   History of Anesthesia Complications (+) PONV and history of anesthetic complications  Airway Mallampati: II  TM Distance: >3 FB Neck ROM: full    Dental no notable dental hx.    Pulmonary neg pulmonary ROS,    Pulmonary exam normal        Cardiovascular Exercise Tolerance: Good (-) CAD (pt denies) negative cardio ROS Normal cardiovascular exam     Neuro/Psych negative neurological ROS  negative psych ROS   GI/Hepatic negative GI ROS, Neg liver ROS,   Endo/Other  Morbid obesity (bmi=32)  Renal/GU negative Renal ROS  negative genitourinary   Musculoskeletal   Abdominal   Peds  Hematology negative hematology ROS (+)   Anesthesia Other Findings   Reproductive/Obstetrics                             Anesthesia Physical Anesthesia Plan  ASA: II  Anesthesia Plan: General ETT   Post-op Pain Management:    Induction:   PONV Risk Score and Plan:   Airway Management Planned:   Additional Equipment:   Intra-op Plan:   Post-operative Plan:   Informed Consent: I have reviewed the patients History and Physical, chart, labs and discussed the procedure including the risks, benefits and alternatives for the proposed anesthesia with the patient or authorized representative who has indicated his/her understanding and acceptance.     Plan Discussed with: CRNA  Anesthesia Plan Comments:         Anesthesia Quick Evaluation

## 2018-05-11 NOTE — Anesthesia Procedure Notes (Signed)
Procedure Name: LMA Insertion Date/Time: 05/11/2018 10:53 AM Performed by: Mayme Genta, CRNA Pre-anesthesia Checklist: Patient identified, Emergency Drugs available, Suction available, Timeout performed and Patient being monitored Patient Re-evaluated:Patient Re-evaluated prior to induction Oxygen Delivery Method: Circle system utilized Preoxygenation: Pre-oxygenation with 100% oxygen Induction Type: IV induction LMA: LMA inserted LMA Size: 4.0 Number of attempts: 1 Placement Confirmation: positive ETCO2 and breath sounds checked- equal and bilateral Tube secured with: Tape

## 2018-05-11 NOTE — Op Note (Signed)
05/11/2018  11:26 AM    Eric Vargas  [id number]   Pre-Op Dx: RIGHT NECK MASS  Post-op Dx: SAME  Proc: Excision right anterior neck lipoma  Surg:  Roena Malady  Anes:  GOT  EBL: Less than 5 cc  Comp: None  Findings: 3 x 5 cm lipoma right anterior neck  Procedure: Right was identified holding area take the operating placed in supine position.  After laryngeal mask anesthesia the right neck was prepped and draped sterilely.  Local anesthetic 1% lidocaine with 1 100,000 units of epinephrine was used to inject overlying the mass.  A total of 3 cc was used.  With the neck prepped and draped sterilely 15 blade was used to incise down to and into the subcutaneous tissues.  Immediately the lipomatous mass was identified.  Dissection was carried out using the Bovie cautery and a short sharp scissors.  The lipoma was kept within the capsule and excised down to the platysma muscle.  This mass measured approximately 3 x 5 cm.  The mass completely removed the wound was copiously irrigated any bleeding points arise using the Bovie cautery.  The deep layer was closed using 4-0 Vicryl subcutaneous tissues closed using 4-0 Vicryl and the skin was closed using Dermabond she was returned to anesthesia where he was awakened in the operating room trach carbon stable condition.  Cultures: None  Specimens: Right neck lipoma    Dispo:   Good  Plan: Discharged home follow-up 1 week  Roena Malady  05/11/2018 11:26 AM

## 2018-05-14 ENCOUNTER — Encounter: Payer: Self-pay | Admitting: Unknown Physician Specialty

## 2018-05-15 LAB — SURGICAL PATHOLOGY

## 2018-05-17 ENCOUNTER — Ambulatory Visit
Admission: RE | Admit: 2018-05-17 | Discharge: 2018-05-17 | Disposition: A | Discharge status: Home / Self Care | Payer: BLUE CROSS/BLUE SHIELD | Source: Ambulatory Visit | Attending: Unknown Physician Specialty | Admitting: Unknown Physician Specialty

## 2018-05-17 DIAGNOSIS — M4807 Spinal stenosis, lumbosacral region: Secondary | ICD-10-CM | POA: Diagnosis not present

## 2018-05-17 DIAGNOSIS — M5116 Intervertebral disc disorders with radiculopathy, lumbar region: Secondary | ICD-10-CM | POA: Diagnosis not present

## 2018-05-17 DIAGNOSIS — M5430 Sciatica, unspecified side: Secondary | ICD-10-CM | POA: Diagnosis not present

## 2018-05-17 DIAGNOSIS — M48061 Spinal stenosis, lumbar region without neurogenic claudication: Secondary | ICD-10-CM | POA: Insufficient documentation

## 2018-05-17 MED ORDER — GADOBENATE DIMEGLUMINE 529 MG/ML IV SOLN
20.0000 mL | Freq: Once | INTRAVENOUS | Status: AC | PRN
  Administered 2018-05-17: 20 mL via INTRAVENOUS

## 2018-05-29 DIAGNOSIS — M9903 Segmental and somatic dysfunction of lumbar region: Secondary | ICD-10-CM | POA: Diagnosis not present

## 2018-05-29 DIAGNOSIS — G5722 Lesion of femoral nerve, left lower limb: Secondary | ICD-10-CM | POA: Diagnosis not present

## 2018-06-05 DIAGNOSIS — M5386 Other specified dorsopathies, lumbar region: Secondary | ICD-10-CM | POA: Diagnosis not present

## 2018-06-05 DIAGNOSIS — M531 Cervicobrachial syndrome: Secondary | ICD-10-CM | POA: Diagnosis not present

## 2018-06-05 DIAGNOSIS — M545 Low back pain: Secondary | ICD-10-CM | POA: Diagnosis not present

## 2018-06-05 DIAGNOSIS — G5722 Lesion of femoral nerve, left lower limb: Secondary | ICD-10-CM | POA: Diagnosis not present

## 2018-06-12 DIAGNOSIS — G5722 Lesion of femoral nerve, left lower limb: Secondary | ICD-10-CM | POA: Diagnosis not present

## 2018-06-12 DIAGNOSIS — M9903 Segmental and somatic dysfunction of lumbar region: Secondary | ICD-10-CM | POA: Diagnosis not present

## 2018-06-12 DIAGNOSIS — M5417 Radiculopathy, lumbosacral region: Secondary | ICD-10-CM | POA: Diagnosis not present

## 2018-06-12 DIAGNOSIS — M5386 Other specified dorsopathies, lumbar region: Secondary | ICD-10-CM | POA: Diagnosis not present

## 2018-06-20 DIAGNOSIS — M5417 Radiculopathy, lumbosacral region: Secondary | ICD-10-CM | POA: Diagnosis not present

## 2018-06-20 DIAGNOSIS — M5386 Other specified dorsopathies, lumbar region: Secondary | ICD-10-CM | POA: Diagnosis not present

## 2018-06-20 DIAGNOSIS — G5722 Lesion of femoral nerve, left lower limb: Secondary | ICD-10-CM | POA: Diagnosis not present

## 2018-06-20 DIAGNOSIS — M9903 Segmental and somatic dysfunction of lumbar region: Secondary | ICD-10-CM | POA: Diagnosis not present

## 2018-06-26 DIAGNOSIS — Z6833 Body mass index (BMI) 33.0-33.9, adult: Secondary | ICD-10-CM | POA: Diagnosis not present

## 2018-06-26 DIAGNOSIS — R030 Elevated blood-pressure reading, without diagnosis of hypertension: Secondary | ICD-10-CM | POA: Diagnosis not present

## 2018-06-26 DIAGNOSIS — M5416 Radiculopathy, lumbar region: Secondary | ICD-10-CM | POA: Diagnosis not present

## 2018-06-26 DIAGNOSIS — M5126 Other intervertebral disc displacement, lumbar region: Secondary | ICD-10-CM | POA: Diagnosis not present

## 2018-09-07 DIAGNOSIS — M1612 Unilateral primary osteoarthritis, left hip: Secondary | ICD-10-CM | POA: Diagnosis not present

## 2018-09-12 DIAGNOSIS — M1612 Unilateral primary osteoarthritis, left hip: Secondary | ICD-10-CM | POA: Diagnosis not present

## 2018-10-02 DIAGNOSIS — R030 Elevated blood-pressure reading, without diagnosis of hypertension: Secondary | ICD-10-CM | POA: Diagnosis not present

## 2018-10-02 DIAGNOSIS — M5416 Radiculopathy, lumbar region: Secondary | ICD-10-CM | POA: Diagnosis not present

## 2018-10-02 DIAGNOSIS — Z6833 Body mass index (BMI) 33.0-33.9, adult: Secondary | ICD-10-CM | POA: Diagnosis not present

## 2018-10-10 DIAGNOSIS — R5383 Other fatigue: Secondary | ICD-10-CM | POA: Diagnosis not present

## 2018-10-10 DIAGNOSIS — M1612 Unilateral primary osteoarthritis, left hip: Secondary | ICD-10-CM | POA: Diagnosis not present

## 2018-10-10 LAB — CBC AND DIFFERENTIAL
HCT: 37 — AB (ref 41–53)
Hemoglobin: 13.1 — AB (ref 13.5–17.5)
Neutrophils Absolute: 3
Platelets: 258 (ref 150–399)
WBC: 4.9

## 2018-10-10 LAB — BASIC METABOLIC PANEL
BUN: 24 — AB (ref 4–21)
Creatinine: 1 (ref 0.6–1.3)
Glucose: 105
Potassium: 4.7 (ref 3.4–5.3)
Sodium: 138 (ref 137–147)

## 2018-10-10 LAB — HEPATIC FUNCTION PANEL
ALT: 55 — AB (ref 10–40)
AST: 32 (ref 14–40)
Alkaline Phosphatase: 64 (ref 25–125)
Bilirubin, Total: 0.3

## 2018-10-10 LAB — IRON,TIBC AND FERRITIN PANEL: Iron: 94

## 2018-10-10 LAB — LIPID PANEL
LDL Cholesterol: 150
Triglycerides: 140 (ref 40–160)

## 2018-10-10 LAB — TSH: TSH: 2.63 (ref 0.41–5.90)

## 2018-10-19 LAB — LIPID PANEL
Cholesterol: 249 — AB (ref 0–200)
HDL: 70 (ref 35–70)
LDL Cholesterol: 135

## 2018-10-19 LAB — PSA: PSA: 0.4

## 2018-11-02 DIAGNOSIS — D2272 Melanocytic nevi of left lower limb, including hip: Secondary | ICD-10-CM | POA: Diagnosis not present

## 2018-11-02 DIAGNOSIS — D2261 Melanocytic nevi of right upper limb, including shoulder: Secondary | ICD-10-CM | POA: Diagnosis not present

## 2018-11-02 DIAGNOSIS — D225 Melanocytic nevi of trunk: Secondary | ICD-10-CM | POA: Diagnosis not present

## 2018-11-02 DIAGNOSIS — L570 Actinic keratosis: Secondary | ICD-10-CM | POA: Diagnosis not present

## 2018-11-02 DIAGNOSIS — D2262 Melanocytic nevi of left upper limb, including shoulder: Secondary | ICD-10-CM | POA: Diagnosis not present

## 2018-11-02 DIAGNOSIS — D485 Neoplasm of uncertain behavior of skin: Secondary | ICD-10-CM | POA: Diagnosis not present

## 2019-03-26 DIAGNOSIS — M5442 Lumbago with sciatica, left side: Secondary | ICD-10-CM | POA: Diagnosis not present

## 2019-03-26 DIAGNOSIS — M5441 Lumbago with sciatica, right side: Secondary | ICD-10-CM | POA: Diagnosis not present

## 2019-03-26 DIAGNOSIS — G8929 Other chronic pain: Secondary | ICD-10-CM | POA: Diagnosis not present

## 2019-04-10 ENCOUNTER — Other Ambulatory Visit: Payer: Self-pay | Admitting: Neurosurgery

## 2019-04-10 DIAGNOSIS — M5441 Lumbago with sciatica, right side: Secondary | ICD-10-CM

## 2019-04-10 DIAGNOSIS — M5442 Lumbago with sciatica, left side: Secondary | ICD-10-CM

## 2019-04-12 ENCOUNTER — Telehealth: Payer: Self-pay

## 2019-04-12 NOTE — Telephone Encounter (Signed)
Spoke with patient to screen his medications and allergies prior to him being scheduled for a myelogram.  He was informed he will be here 2-2.5 hours, needs a driver and needs to be on strict bedrest for 24 hours.  There are no medications he needs to hold for this procedure.

## 2019-05-14 ENCOUNTER — Other Ambulatory Visit: Payer: Self-pay

## 2019-05-14 ENCOUNTER — Ambulatory Visit
Admission: RE | Admit: 2019-05-14 | Discharge: 2019-05-14 | Disposition: A | Discharge status: Home / Self Care | Payer: BC Managed Care – PPO | Source: Ambulatory Visit | Attending: Neurosurgery | Admitting: Neurosurgery

## 2019-05-14 DIAGNOSIS — M5442 Lumbago with sciatica, left side: Secondary | ICD-10-CM

## 2019-05-14 DIAGNOSIS — M5441 Lumbago with sciatica, right side: Secondary | ICD-10-CM

## 2019-05-14 DIAGNOSIS — M48061 Spinal stenosis, lumbar region without neurogenic claudication: Secondary | ICD-10-CM | POA: Diagnosis not present

## 2019-05-14 DIAGNOSIS — M545 Low back pain: Secondary | ICD-10-CM | POA: Diagnosis not present

## 2019-05-14 MED ORDER — IOPAMIDOL (ISOVUE-M 200) INJECTION 41%
20.0000 mL | Freq: Once | INTRAMUSCULAR | Status: AC
Start: 2019-05-14 — End: 2019-05-14
  Administered 2019-05-14: 20 mL via INTRATHECAL

## 2019-05-14 MED ORDER — DIAZEPAM 5 MG PO TABS
10.0000 mg | ORAL_TABLET | Freq: Once | ORAL | Status: AC
  Administered 2019-05-14: 10 mg via ORAL

## 2019-05-14 NOTE — Discharge Instructions (Signed)

## 2019-05-17 DIAGNOSIS — M5416 Radiculopathy, lumbar region: Secondary | ICD-10-CM | POA: Diagnosis not present

## 2019-05-17 DIAGNOSIS — M5126 Other intervertebral disc displacement, lumbar region: Secondary | ICD-10-CM | POA: Diagnosis not present

## 2019-05-17 DIAGNOSIS — M1612 Unilateral primary osteoarthritis, left hip: Secondary | ICD-10-CM | POA: Diagnosis not present

## 2019-05-17 DIAGNOSIS — M545 Low back pain: Secondary | ICD-10-CM | POA: Diagnosis not present

## 2019-05-20 ENCOUNTER — Other Ambulatory Visit: Payer: Self-pay | Admitting: Internal Medicine

## 2019-05-20 ENCOUNTER — Encounter: Payer: Self-pay | Admitting: Internal Medicine

## 2019-05-20 ENCOUNTER — Telehealth: Payer: Self-pay | Admitting: General Practice

## 2019-05-20 ENCOUNTER — Ambulatory Visit (INDEPENDENT_AMBULATORY_CARE_PROVIDER_SITE_OTHER): Payer: BC Managed Care – PPO | Admitting: Internal Medicine

## 2019-05-20 ENCOUNTER — Other Ambulatory Visit: Payer: Self-pay

## 2019-05-20 DIAGNOSIS — Z01818 Encounter for other preprocedural examination: Secondary | ICD-10-CM

## 2019-05-20 DIAGNOSIS — Z125 Encounter for screening for malignant neoplasm of prostate: Secondary | ICD-10-CM

## 2019-05-20 DIAGNOSIS — M48061 Spinal stenosis, lumbar region without neurogenic claudication: Secondary | ICD-10-CM | POA: Diagnosis not present

## 2019-05-20 DIAGNOSIS — M1612 Unilateral primary osteoarthritis, left hip: Secondary | ICD-10-CM | POA: Diagnosis not present

## 2019-05-20 DIAGNOSIS — E782 Mixed hyperlipidemia: Secondary | ICD-10-CM

## 2019-05-20 DIAGNOSIS — Z1211 Encounter for screening for malignant neoplasm of colon: Secondary | ICD-10-CM

## 2019-05-20 NOTE — Assessment & Plan Note (Signed)
Mild, with no history of heart disease, hypertension or diabetes  No intervention at this time.

## 2019-05-20 NOTE — Progress Notes (Signed)
Telephone  Note  This visit type was conducted due to national recommendations for restrictions regarding the COVID-19 pandemic (e.g. social distancing).  This format is felt to be most appropriate for this patient at this time.  All issues noted in this document were discussed and addressed.  No physical exam was performed (except for noted visual exam findings with Video Visits).   I connected with@ on 05/20/19 at  4:30 PM EDT by telephone and verified that I am speaking with the correct person using two identifiers. Location patient: home Location provider: work or home office Persons participating in the virtual visit: patient, provider  I discussed the limitations, risks, security and privacy concerns of performing an evaluation and management service by telephone and the availability of in person appointments. I also discussed with the patient that there may be a patient responsible charge related to this service. The patient expressed understanding and agreed to proceed.    Reason for visit: establish care HPI:  63 yr old male with no significant PMH presents for establishment of care and preoperative evaluation in preparation for an elective hip replacement .  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  He has no history of arrhythmia,  Chest pain, cough,  Or shortness of breath.  He takes no medications and had normal comprehensive panel and CBC in November 2019    Back pain /sciatica:  His pain is intermittent and positional. He has a Remote laminectomy history .   Had a myelogram june 23 by Arnoldo Morale. Bilateral l3 neural l impingement   Noted, severe stenosis L2-L3 due to ligamentum flavum infolding , bilateral l4 nerve root effacement .    Hip pain: he has severe arthritic changes in the right hip and has been advised by neurosurgery to have the hip addressed first .  He is tentatively scheduled to have  hip replacement  next thursday    ROS: See pertinent positives and negatives per HPI.  Past Medical History:  Diagnosis Date  . Abnormal chest CT   . Hemorrhoids, external   . PONV (postoperative nausea and vomiting)   . Rotator cuff injury    Right AND LEFT shoulder    Past Surgical History:  Procedure Laterality Date  . COLONOSCOPY WITH PROPOFOL N/A 12/30/2016   Procedure: COLONOSCOPY WITH PROPOFOL;  Surgeon: [name], MD;  Location: [place];  Service: Endoscopy;  Laterality: N/A;  wants to be as early as possible  . Disk repair  1989   At [facility]  . EXCISION MASS NECK Right 05/11/2018   Procedure: EXCISION MASS NECK;  Surgeon: [name], MD;  Location: [place];  Service: ENT;  Laterality: Right;  . ROTATOR CUFF REPAIR  2000   Left AND RIGHT    Family History  Problem Relation Age of Onset  . Aortic stenosis Mother   . Colon cancer Father     SOCIAL HX:  reports that he has never smoked. His smokeless tobacco use includes chew. He reports current alcohol use of about 7.0 standard drinks of alcohol per week. He reports that he does not use drugs.   Current Outpatient Medications:  .  Bioflavonoid Products (ESTER C PO), Take by mouth daily., Disp: , Rfl:   EXAM:   General impression: alert, cooperative and articulate.  No signs of being in distress  Lungs: speech is fluent sentence length suggests that patient is not short of breath  and not punctuated by cough, sneezing or sniffing. Marland Kitchen   Psych: affect normal.  speech is articulate and non pressured .  Denies suicidal thoughts   ASSESSMENT AND PLAN:  HYPERLIPIDEMIA TYPE IIB / III Mild, with no history of heart disease, hypertension or diabetes  No intervention at this time.   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  Chest x ray ordered.  Barring any unforeseen findings,  He  will be considered medically stable for elective hip replacement.   Degenerative lumbar spinal stenosis History of remote laminectomy.  Recent CT /MRI myelogram notes severe stenosis L2-L3 due to ligamentum flavum infolding,  Bilateral L3 neural impingement and L4 nerve root effacement     I discussed the assessment and treatment plan with the patient. The patient was provided an opportunity to ask questions and all were answered. The patient agreed with the plan and demonstrated an understanding of the instructions.   The patient was advised to call back or seek an in-person evaluation if the symptoms worsen or if the condition fails to improve as anticipated.  I provided 30 minutes of non-face-to-face time during this encounter.   [name], MD

## 2019-05-20 NOTE — Assessment & Plan Note (Signed)
History of remote laminectomy.  Recent CT /MRI myelogram notes severe stenosis L2-L3 due to ligamentum flavum infolding,  Bilateral L3 neural impingement and L4 nerve root effacement

## 2019-05-20 NOTE — Assessment & Plan Note (Addendum)
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  Chest x ray ordered.  Barring any unforeseen findings,  He will be considered medically stable for elective hip replacement.

## 2019-05-20 NOTE — Telephone Encounter (Signed)
Lm on vm for pt to call back and set up a new patient appt with Dr. Derrel Nip.

## 2019-05-21 ENCOUNTER — Other Ambulatory Visit: Payer: BC Managed Care – PPO

## 2019-05-21 ENCOUNTER — Telehealth: Payer: Self-pay

## 2019-05-21 ENCOUNTER — Ambulatory Visit (INDEPENDENT_AMBULATORY_CARE_PROVIDER_SITE_OTHER): Payer: BC Managed Care – PPO

## 2019-05-21 DIAGNOSIS — Z01818 Encounter for other preprocedural examination: Secondary | ICD-10-CM

## 2019-05-21 NOTE — Telephone Encounter (Signed)
LMTCB. Need to schedule pt for a chest xray order is in.

## 2019-05-22 ENCOUNTER — Other Ambulatory Visit: Payer: Self-pay

## 2019-05-27 ENCOUNTER — Other Ambulatory Visit: Payer: Self-pay

## 2019-05-27 NOTE — Telephone Encounter (Signed)
Patient needs to have a chest  X ray ASAP and I am stil waiting for the pre op clearance letter

## 2019-05-28 ENCOUNTER — Ambulatory Visit: Admission: RE | Admit: 2019-05-28 | Payer: BC Managed Care – PPO | Source: Ambulatory Visit

## 2019-05-28 ENCOUNTER — Other Ambulatory Visit: Payer: Self-pay

## 2019-05-28 ENCOUNTER — Other Ambulatory Visit: Payer: Self-pay | Admitting: Internal Medicine

## 2019-05-28 ENCOUNTER — Ambulatory Visit: Payer: BC Managed Care – PPO

## 2019-05-28 DIAGNOSIS — Z01818 Encounter for other preprocedural examination: Secondary | ICD-10-CM

## 2019-05-28 DIAGNOSIS — M1612 Unilateral primary osteoarthritis, left hip: Secondary | ICD-10-CM | POA: Diagnosis not present

## 2019-05-28 DIAGNOSIS — M25552 Pain in left hip: Secondary | ICD-10-CM | POA: Diagnosis not present

## 2019-05-28 NOTE — Telephone Encounter (Signed)
I got the pre op form faxed and I have placed it in your red folder.

## 2019-05-28 NOTE — Telephone Encounter (Signed)
His X-ray has been fixed. Whomever scheduled his appt did not attach the x-ray. Someone contacted [facility] & they basically took the images from our location. I have called [facility] & got them back & fixed the order & submitted his x-ray as a STAT. Should result anytime now.

## 2019-05-28 NOTE — Telephone Encounter (Signed)
Also I called the pt on 05/21/2019 and left a voicemail for pt to call the office back to schedule the appt for the xray.

## 2019-05-28 NOTE — Telephone Encounter (Signed)
He HAD the X RAY ,  He does not need another one  it went to the "new"  PACS SYSTEM AND HAS BEEN SITTING THERE  FOR 5 DAYS AND STILL HASN'T BEEN READ!! PLEASE LET [facility]

## 2019-05-30 DIAGNOSIS — Z96651 Presence of right artificial knee joint: Secondary | ICD-10-CM | POA: Diagnosis not present

## 2019-05-30 DIAGNOSIS — M1612 Unilateral primary osteoarthritis, left hip: Secondary | ICD-10-CM | POA: Diagnosis not present

## 2019-08-16 DIAGNOSIS — M5126 Other intervertebral disc displacement, lumbar region: Secondary | ICD-10-CM | POA: Diagnosis not present

## 2019-11-06 DIAGNOSIS — Z96642 Presence of left artificial hip joint: Secondary | ICD-10-CM | POA: Diagnosis not present

## 2019-11-12 DIAGNOSIS — R5383 Other fatigue: Secondary | ICD-10-CM | POA: Diagnosis not present

## 2019-11-12 LAB — CBC AND DIFFERENTIAL
Hemoglobin: 12.7 — AB (ref 13.5–17.5)
Platelets: 264 (ref 150–399)
WBC: 4.3

## 2019-11-12 LAB — LIPID PANEL
Cholesterol: 206 — AB (ref 0–200)
HDL: 59 (ref 35–70)
LDL Cholesterol: 129
Triglycerides: 102 (ref 40–160)

## 2019-11-12 LAB — BASIC METABOLIC PANEL
BUN: 20 (ref 4–21)
Chloride: 104 (ref 99–108)
Creatinine: 1 (ref 0.6–1.3)
Glucose: 107
Potassium: 4.9 (ref 3.4–5.3)
Sodium: 140 (ref 137–147)

## 2019-11-12 LAB — HEPATIC FUNCTION PANEL
ALT: 36 (ref 10–40)
AST: 32 (ref 14–40)
Alkaline Phosphatase: 77 (ref 25–125)
Bilirubin, Total: 0.4

## 2019-11-12 LAB — TSH: TSH: 2.9 (ref 0.41–5.90)

## 2019-11-13 LAB — COMPREHENSIVE METABOLIC PANEL
Albumin: 4.2 (ref 3.5–5.0)
Calcium: 9.7 (ref 8.7–10.7)

## 2019-11-14 LAB — NOVEL CORONAVIRUS, NAA: SARS-CoV-2, NAA: POSITIVE

## 2019-11-24 ENCOUNTER — Encounter: Payer: Self-pay | Admitting: Internal Medicine

## 2020-06-04 DIAGNOSIS — D2262 Melanocytic nevi of left upper limb, including shoulder: Secondary | ICD-10-CM | POA: Diagnosis not present

## 2020-06-04 DIAGNOSIS — D2272 Melanocytic nevi of left lower limb, including hip: Secondary | ICD-10-CM | POA: Diagnosis not present

## 2020-06-04 DIAGNOSIS — D2261 Melanocytic nevi of right upper limb, including shoulder: Secondary | ICD-10-CM | POA: Diagnosis not present

## 2020-06-04 DIAGNOSIS — D225 Melanocytic nevi of trunk: Secondary | ICD-10-CM | POA: Diagnosis not present

## 2020-06-04 DIAGNOSIS — L300 Nummular dermatitis: Secondary | ICD-10-CM | POA: Diagnosis not present

## 2020-06-04 DIAGNOSIS — L821 Other seborrheic keratosis: Secondary | ICD-10-CM | POA: Diagnosis not present

## 2020-06-04 DIAGNOSIS — D2271 Melanocytic nevi of right lower limb, including hip: Secondary | ICD-10-CM | POA: Diagnosis not present

## 2020-06-26 DIAGNOSIS — Z20822 Contact with and (suspected) exposure to covid-19: Secondary | ICD-10-CM | POA: Diagnosis not present

## 2020-08-13 DIAGNOSIS — Z03818 Encounter for observation for suspected exposure to other biological agents ruled out: Secondary | ICD-10-CM | POA: Diagnosis not present

## 2020-10-12 IMAGING — CT CT LUMBAR SPINE WITH CONTRAST
1 of 7 series · 5 of 14 positions shown, 7 images · non-contrast
Comparison: MRI lumbar spine 05/17/2018.

CLINICAL DATA: Low back pain.   BILATERAL leg pain.
TECHNIQUE: Contiguous axial images were obtained through the Lumbar spine after
the intrathecal infusion of infusion. Coronal and sagittal
reconstructions were obtained of the axial image sets.

[Series 3: l spine soft · axial · 0.32mm/px · z∈[-248,-89]mm · 5 of 81 slices shown, 7 images]
[im 14/81  soft-tissue]
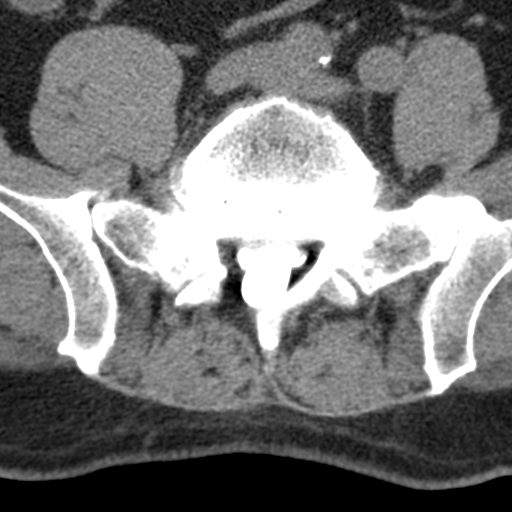
[im 14/81  bone]
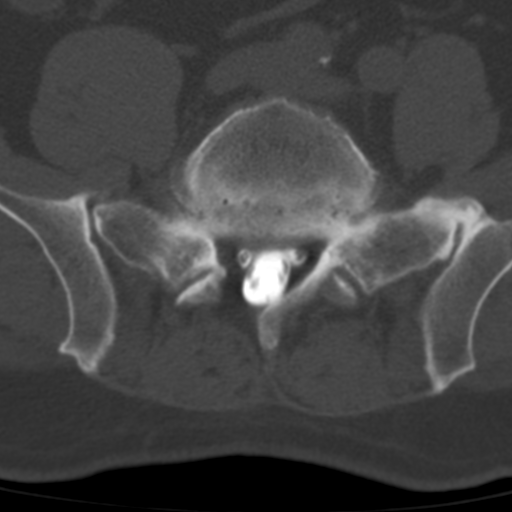
[im 27/81  bone]
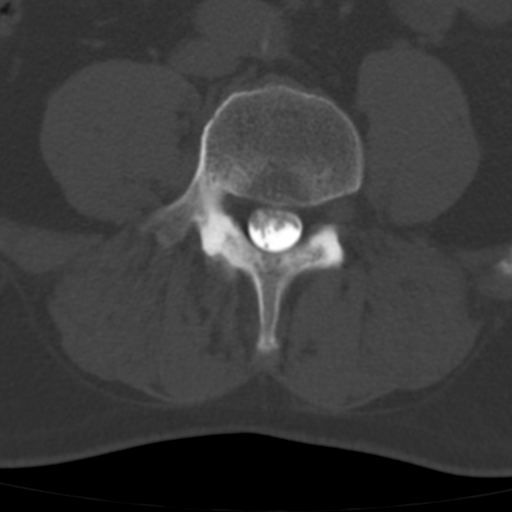
[im 41/81  bone]
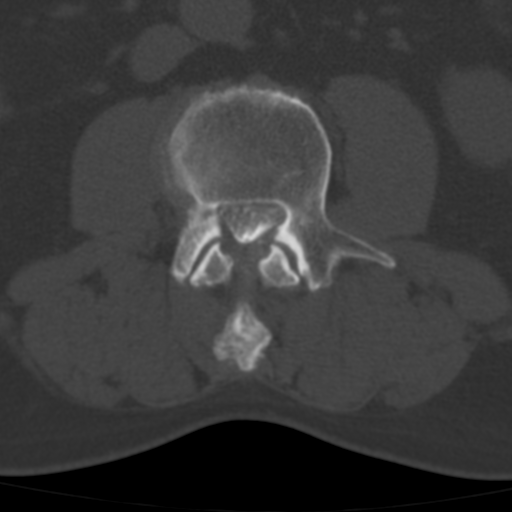
[im 54/81  bone]
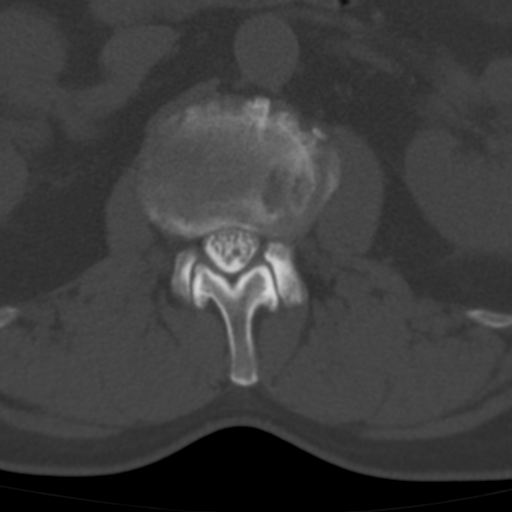
[im 67/81  soft-tissue]
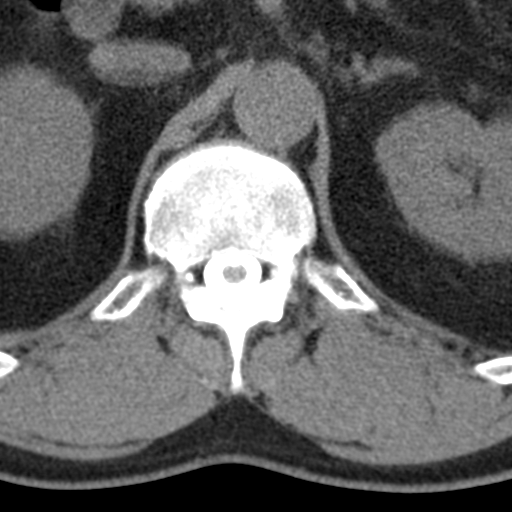
[im 67/81  bone]
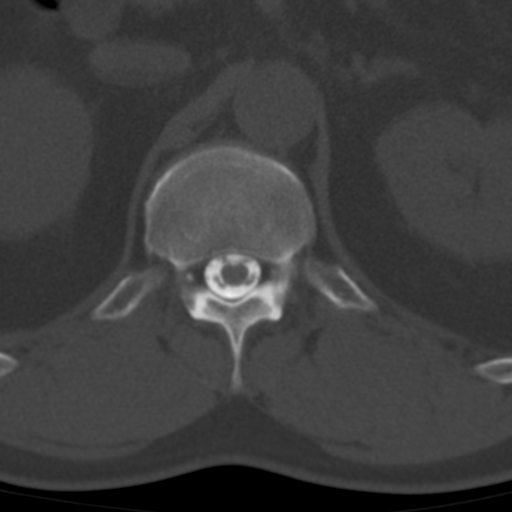

[5 of 14 positions shown; findings below may reference images not displayed]

EXAM:
LUMBAR MYELOGRAM

FLUOROSCOPY TIME:  41 seconds corresponding to a Dose Area Product
of 338 Gy*m2

PROCEDURE:
After thorough discussion of risks and benefits of the procedure
including bleeding, infection, injury to nerves, blood vessels,
adjacent structures as well as headache and CSF leak, written and
oral informed consent was obtained. Consent was obtained by Dr. Saliwa
Jim. Time out form was completed.

Patient was positioned prone on the fluoroscopy table. Local
anesthesia was provided with 1% lidocaine without epinephrine after
prepped and draped in the usual sterile fashion. Puncture was
performed at L3-L4 using a 3 1/2 inch 22-gauge spinal needle via
midline approach. Using a single pass through the dura, the needle
was placed within the thecal sac, with return of clear CSF. 15 mL of
Isovue [id number] was injected into the thecal sac, with normal
opacification of the nerve roots and cauda equina consistent with
free flow within the subarachnoid space.

I personally performed the lumbar puncture and administered the
intrathecal contrast. I also personally supervised acquisition of
the myelogram images.
FINDINGS: LUMBAR MYELOGRAM FINDINGS:

Good opacification lumbar subarachnoid space. Degenerative scoliosis
convex RIGHT L1-L2, approximately 10 degrees. Severe stenosis L2-L3,
subtotal block, posterior element hypertrophy and ventral disc
material. BILATERAL L3 neural impingement. Mild to moderate stenosis
L3-4, BILATERAL L4 nerve root effacement.

No stenosis at L4-5 or L5-S1. Slight extradural defect L1-2 on the
LEFT. With patient standing, there is 3 mm anterolisthesis L4-5 with
patient in flexion. This reduces in neutral and extension.
Ligamentum flavum infolding contributes to severe stenosis at L2-3
with patient in standing neutral and extension. There is 1 mm
anterolisthesis L3 on L4, roughly stable between flexion extension.

CT LUMBAR MYELOGRAM FINDINGS:

Segmentation: Normal.

Alignment: Degenerative scoliosis convex RIGHT apex L2 approximately
10 degrees. No significant subluxation.

Vertebrae: No worrisome osseous lesion.

Conus medullaris: Normal in size and location.

Paraspinal tissues: No evidence for hydronephrosis or paravertebral
mass.

Disc levels:

L1-L2: Annular bulge. Osseous spurring to the LEFT. No definite L1
and L2 neural impingement.

L2-L3: Annular bulge. Extraforaminal protrusion to the LEFT.
Prominent ligamentum flavum infolding greater on the LEFT. Mild to
moderate stenosis. LEFT greater than RIGHT L2 and L3 neural
impingement.

L3-L4: Advanced disc space narrowing with osseous spurring greater
on the RIGHT. Annular bulge. Posterior element hypertrophy. Mild
stenosis. RIGHT greater than LEFT L4 and L3 neural impingement.

L4-L5: Annular bulge. Facet arthropathy. Schmorl's node. Borderline
stenosis. No definite L5 neural impingement. Moderate foraminal
narrowing could affect either L4 nerve root.

L5-S1: Advanced disc space narrowing. Osseous spurring. Facet
arthropathy. RIGHT laminotomy. No subarticular zone narrowing.
BILATERAL foraminal narrowing could affect either L5 nerve root.
IMPRESSION: LUMBAR MYELOGRAM IMPRESSION:

Severe spinal stenosis at L2-3 related to posterior element
hypertrophy. Some ventral disc material is present.

1 mm anterolisthesis at L3-4, without significant neural impingement
at this region.

Mild dynamic instability at L4-5, up to 3 mm anterolisthesis in
flexion.

CT LUMBAR MYELOGRAM IMPRESSION:

Mild-to-moderate stenosis at L2-3 is related to ligamentum flavum
infolding primarily. LEFT greater than RIGHT neural impingement.
Leftward extraforaminal protrusion is also noted. Stenosis is
greater with patient upright, suggesting ligamentum flavum
contribution predominates to impingement.

Disc space narrowing L5-S1, RIGHT laminotomy related to prior
surgery. BILATERAL foraminal narrowing could affect either L5 nerve
root.

Advanced disc space narrowing L3-4, osseous spurring, posterior
element hypertrophy. RIGHT greater than LEFT L4 and L3 neural
impingement.

## 2021-03-30 DIAGNOSIS — R5383 Other fatigue: Secondary | ICD-10-CM | POA: Diagnosis not present

## 2021-03-30 LAB — HEPATIC FUNCTION PANEL
ALT: 44 — AB (ref 10–40)
AST: 36 (ref 14–40)
Alkaline Phosphatase: 63 (ref 25–125)
Bilirubin, Total: 0.3

## 2021-03-30 LAB — BASIC METABOLIC PANEL
BUN: 19 (ref 4–21)
Chloride: 99 (ref 99–108)
Creatinine: 0.9 (ref 0.6–1.3)
Glucose: 105
Potassium: 5.3 (ref 3.4–5.3)
Sodium: 137 (ref 137–147)

## 2021-03-30 LAB — COMPREHENSIVE METABOLIC PANEL
Albumin: 4.5 (ref 3.5–5.0)
Calcium: 9.5 (ref 8.7–10.7)
Globulin: 1.9

## 2021-03-30 LAB — LIPID PANEL
Cholesterol: 223 — AB (ref 0–200)
HDL: 63 (ref 35–70)
LDL Cholesterol: 139
Triglycerides: 117 (ref 40–160)

## 2021-03-30 LAB — IRON,TIBC AND FERRITIN PANEL: Iron: 96

## 2021-03-30 LAB — CBC AND DIFFERENTIAL
HCT: 39 — AB (ref 41–53)
Hemoglobin: 13.3 — AB (ref 13.5–17.5)
Neutrophils Absolute: 2.4
Platelets: 253 (ref 150–399)
WBC: 4.4

## 2021-03-30 LAB — TSH: TSH: 2.74 (ref 0.41–5.90)

## 2021-03-30 LAB — CBC: RBC: 4.36 (ref 3.87–5.11)

## 2021-04-14 ENCOUNTER — Other Ambulatory Visit: Payer: Self-pay

## 2021-06-07 DIAGNOSIS — D2261 Melanocytic nevi of right upper limb, including shoulder: Secondary | ICD-10-CM | POA: Diagnosis not present

## 2021-06-07 DIAGNOSIS — L408 Other psoriasis: Secondary | ICD-10-CM | POA: Diagnosis not present

## 2021-06-07 DIAGNOSIS — D2272 Melanocytic nevi of left lower limb, including hip: Secondary | ICD-10-CM | POA: Diagnosis not present

## 2021-06-07 DIAGNOSIS — D2262 Melanocytic nevi of left upper limb, including shoulder: Secondary | ICD-10-CM | POA: Diagnosis not present

## 2021-06-07 DIAGNOSIS — X32XXXA Exposure to sunlight, initial encounter: Secondary | ICD-10-CM | POA: Diagnosis not present

## 2021-06-07 DIAGNOSIS — D225 Melanocytic nevi of trunk: Secondary | ICD-10-CM | POA: Diagnosis not present

## 2021-06-07 DIAGNOSIS — L570 Actinic keratosis: Secondary | ICD-10-CM | POA: Diagnosis not present

## 2021-09-01 DIAGNOSIS — M25561 Pain in right knee: Secondary | ICD-10-CM | POA: Diagnosis not present

## 2021-09-10 DIAGNOSIS — M25561 Pain in right knee: Secondary | ICD-10-CM | POA: Diagnosis not present

## 2021-09-13 DIAGNOSIS — S83241A Other tear of medial meniscus, current injury, right knee, initial encounter: Secondary | ICD-10-CM | POA: Diagnosis not present

## 2021-10-21 DIAGNOSIS — M948X6 Other specified disorders of cartilage, lower leg: Secondary | ICD-10-CM | POA: Diagnosis not present

## 2021-10-21 DIAGNOSIS — S83241A Other tear of medial meniscus, current injury, right knee, initial encounter: Secondary | ICD-10-CM | POA: Diagnosis not present

## 2021-11-16 ENCOUNTER — Ambulatory Visit: Payer: 59 | Admitting: Urology

## 2021-11-16 ENCOUNTER — Other Ambulatory Visit: Payer: Self-pay

## 2021-11-16 ENCOUNTER — Encounter: Payer: Self-pay | Admitting: Urology

## 2021-11-16 VITALS — BP 156/86 | HR 87 | Ht 69.0 in | Wt 237.0 lb

## 2021-11-16 DIAGNOSIS — R310 Gross hematuria: Secondary | ICD-10-CM | POA: Diagnosis not present

## 2021-11-16 DIAGNOSIS — R3129 Other microscopic hematuria: Secondary | ICD-10-CM

## 2021-11-16 LAB — URINALYSIS, COMPLETE
Bilirubin, UA: NEGATIVE
Glucose, UA: NEGATIVE
Leukocytes,UA: NEGATIVE
Nitrite, UA: NEGATIVE
Protein,UA: NEGATIVE
Specific Gravity, UA: >1.03 — ABNORMAL HIGH (ref 1.005–1.030)
Urobilinogen, Ur: 0.2 mg/dL (ref 0.2–1.0)
pH, UA: 5.5 (ref 5.0–7.5)

## 2021-11-16 LAB — MICROSCOPIC EXAMINATION
Bacteria, UA: NONE SEEN
Epithelial Cells (non renal): NONE SEEN /hpf (ref 0–10)

## 2021-11-16 NOTE — Progress Notes (Signed)
° °  11/16/2021 5:01 PM   Eric Vargas Apr 05, 1956 [id number]  Referring provider: [name], MD [place],  [name] [address]  Chief Complaint  Patient presents with   Hematuria    HPI: Eric Vargas is a 65 y.o. male who was found to have hematuria on a recent flight physical.  He believes this was a dipstick urine.  He has been grounded until it has been evaluated.  No bothersome LUTS, dysuria or gross hematuria Denies flank, abdominal or pelvic pain No previous history of urologic problems or stone disease No tobacco history   PMH: Past Medical History:  Diagnosis Date   Abnormal chest CT    Hemorrhoids, external    PONV (postoperative nausea and vomiting)    Rotator cuff injury    Right AND LEFT shoulder    Surgical History: Past Surgical History:  Procedure Laterality Date   COLONOSCOPY WITH PROPOFOL N/A 12/30/2016   Procedure: COLONOSCOPY WITH PROPOFOL;  Surgeon: [name], MD;  Location: [employer];  Service: Endoscopy;  Laterality: N/A;  wants to be as early as possible   Disk repair  1989   At [facility] Right 05/11/2018   Procedure: EXCISION MASS NECK;  Surgeon: [name], MD;  Location: [place];  Service: ENT;  Laterality: Right;   ROTATOR CUFF REPAIR  2000   Left AND RIGHT    Home Medications:  Allergies as of 11/16/2021   No Known Allergies      Medication List        Accurate as of November 16, 2021  5:01 PM. If you have any questions, ask your nurse or doctor.          ESTER C PO Take by mouth daily.        Allergies: No Known Allergies  Family History: Family History  Problem Relation Age of Onset   Aortic stenosis Mother    Colon cancer Father     Social History:  reports that he has never smoked. His smokeless tobacco use includes chew. He reports current alcohol use of about 7.0 standard drinks per week. He reports that he does not use  drugs.   Physical Exam: BP (!) 156/86    Pulse 87    Ht 5\' 9"  (1.753 m)    Wt 237 lb (107.5 kg)    BMI 35.00 kg/m   Constitutional:  Alert and oriented, No acute distress. HEENT: [place] AT, moist mucus membranes.  Trachea midline, no masses. Cardiovascular: No clubbing, cyanosis, or edema. Respiratory: Normal respiratory effort, no increased work of breathing. Neurologic: Grossly intact, no focal deficits, moving all 4 extremities. Psychiatric: Normal mood and affect.  Laboratory Data:  Urinalysis Appearance yellow, slightly cloudy Dipstick 1+ ketones/3+ blood Microscopy 3-10 WBC   Assessment & Plan:    1.  Asymptomatic microhematuria Based on age AUA hematuria risk stratification: High We discussed the standard recommended evaluation for high risk hematuria which is a CT urogram and cystoscopy.  The procedures were discussed in detail.  Potential etiologies of microhematuria were reviewed including both benign conditions and less commonly urologic malignancies CT urogram order was placed and will see if this can be expedited Cystoscopy after [place], MD  [facility] [address] [name], [name] [address] [phone]

## 2021-11-17 ENCOUNTER — Encounter: Payer: Self-pay | Admitting: Urology

## 2021-11-18 ENCOUNTER — Ambulatory Visit
Admission: RE | Admit: 2021-11-18 | Discharge: 2021-11-18 | Disposition: A | Discharge status: Home / Self Care | Payer: 59 | Source: Ambulatory Visit | Attending: Urology | Admitting: Urology

## 2021-11-18 ENCOUNTER — Other Ambulatory Visit: Payer: Self-pay

## 2021-11-18 DIAGNOSIS — R3129 Other microscopic hematuria: Secondary | ICD-10-CM | POA: Insufficient documentation

## 2021-11-18 DIAGNOSIS — K5730 Diverticulosis of large intestine without perforation or abscess without bleeding: Secondary | ICD-10-CM | POA: Diagnosis not present

## 2021-11-18 DIAGNOSIS — Z96642 Presence of left artificial hip joint: Secondary | ICD-10-CM | POA: Diagnosis not present

## 2021-11-18 DIAGNOSIS — I700 Atherosclerosis of aorta: Secondary | ICD-10-CM | POA: Diagnosis not present

## 2021-11-18 LAB — POCT I-STAT CREATININE: Creatinine, Ser: 1.2 mg/dL (ref 0.61–1.24)

## 2021-11-18 MED ORDER — IOHEXOL 350 MG/ML SOLN
100.0000 mL | Freq: Once | INTRAVENOUS | Status: AC | PRN
  Administered 2021-11-18: 14:00:00 100 mL via INTRAVENOUS

## 2021-11-19 ENCOUNTER — Ambulatory Visit: Payer: 59 | Admitting: Urology

## 2021-11-19 ENCOUNTER — Encounter: Payer: Self-pay | Admitting: Urology

## 2021-11-19 VITALS — BP 148/87 | HR 78 | Ht 69.0 in | Wt 235.0 lb

## 2021-11-19 DIAGNOSIS — R3129 Other microscopic hematuria: Secondary | ICD-10-CM

## 2021-11-19 LAB — URINALYSIS, COMPLETE
Bilirubin, UA: NEGATIVE
Glucose, UA: NEGATIVE
Ketones, UA: NEGATIVE
Leukocytes,UA: NEGATIVE
Nitrite, UA: NEGATIVE
Protein,UA: NEGATIVE
Specific Gravity, UA: <1.005 — ABNORMAL LOW (ref 1.005–1.030)
Urobilinogen, Ur: 0.2 mg/dL (ref 0.2–1.0)
pH, UA: 5.5 (ref 5.0–7.5)

## 2021-11-19 LAB — MICROSCOPIC EXAMINATION
Bacteria, UA: NONE SEEN
Epithelial Cells (non renal): NONE SEEN /hpf (ref 0–10)

## 2021-11-19 NOTE — Progress Notes (Signed)
° °  11/19/21  CC:  Chief Complaint  Patient presents with   Cysto    HPI: + blood on flight physical UA.  Urinalysis here 3-10 RBCs.  CT urogram performed yesterday shows no upper tract abnormalities.  No renal calculi or hydronephrosis.  Urinalysis today shows no RBCs  Blood pressure (!) 148/87, pulse 78, height 5\' 9"  (1.753 m), weight 235 lb (106.6 kg).   Cystoscopy Procedure Note  Patient identification was confirmed, informed consent was obtained, and patient was prepped using Betadine solution.  Lidocaine jelly was administered per urethral meatus.     Pre-Procedure: - Inspection reveals a normal caliber urethral meatus.  Procedure: The flexible cystoscope was introduced without difficulty - No urethral strictures/lesions are present. -Mild lateral lobe enlargement prostate with hypervascularity - Normal bladder neck - Bilateral ureteral orifices identified - Bladder mucosa  reveals no ulcers, tumors, or lesions - No bladder stones - No trabeculation  Retroflexion shows hypervascular bladder neck   Post-Procedure: - Patient tolerated the procedure well  Assessment/ Plan: No significant upper tract abnormalities on CT urogram No lower tract abnormalities on cystoscopy.  Microhematuria may be prostatic in etiology No contraindication to flying from a urologic standpoint   John Giovanni, MD    Abbie Sons, MD

## 2021-12-07 DIAGNOSIS — R5383 Other fatigue: Secondary | ICD-10-CM | POA: Diagnosis not present

## 2021-12-15 ENCOUNTER — Telehealth: Payer: Self-pay | Admitting: Internal Medicine

## 2021-12-15 NOTE — Telephone Encounter (Signed)
Spoke with pt and informed him of his lab results and scheduled pt for a follow up.

## 2021-12-15 NOTE — Telephone Encounter (Signed)
[place].  LIVER ENZYMES ARE MILDLY ELEVATED.  PLEASE SCHEDULE AN  APPT TO DISCUSS WORKUP NEEDED

## 2021-12-16 ENCOUNTER — Other Ambulatory Visit: Payer: Self-pay | Admitting: Internal Medicine

## 2021-12-16 DIAGNOSIS — R7401 Elevation of levels of liver transaminase levels: Secondary | ICD-10-CM

## 2021-12-21 ENCOUNTER — Ambulatory Visit
Admission: RE | Admit: 2021-12-21 | Discharge: 2021-12-21 | Disposition: A | Discharge status: Home / Self Care | Payer: 59 | Source: Ambulatory Visit | Attending: Internal Medicine | Admitting: Internal Medicine

## 2021-12-21 ENCOUNTER — Other Ambulatory Visit: Payer: Self-pay

## 2021-12-21 DIAGNOSIS — K760 Fatty (change of) liver, not elsewhere classified: Secondary | ICD-10-CM | POA: Diagnosis not present

## 2021-12-21 DIAGNOSIS — R7401 Elevation of levels of liver transaminase levels: Secondary | ICD-10-CM | POA: Diagnosis not present

## 2021-12-22 DIAGNOSIS — K760 Fatty (change of) liver, not elsewhere classified: Secondary | ICD-10-CM | POA: Insufficient documentation

## 2022-01-05 ENCOUNTER — Ambulatory Visit: Payer: 59 | Admitting: Internal Medicine

## 2022-01-05 ENCOUNTER — Other Ambulatory Visit: Payer: Self-pay

## 2022-06-20 DIAGNOSIS — M65331 Trigger finger, right middle finger: Secondary | ICD-10-CM | POA: Diagnosis not present

## 2022-08-01 DIAGNOSIS — M65331 Trigger finger, right middle finger: Secondary | ICD-10-CM | POA: Diagnosis not present

## 2022-10-05 DIAGNOSIS — L570 Actinic keratosis: Secondary | ICD-10-CM | POA: Diagnosis not present

## 2022-10-05 DIAGNOSIS — D2262 Melanocytic nevi of left upper limb, including shoulder: Secondary | ICD-10-CM | POA: Diagnosis not present

## 2022-10-05 DIAGNOSIS — L408 Other psoriasis: Secondary | ICD-10-CM | POA: Diagnosis not present

## 2022-10-05 DIAGNOSIS — D2271 Melanocytic nevi of right lower limb, including hip: Secondary | ICD-10-CM | POA: Diagnosis not present

## 2022-10-05 DIAGNOSIS — D2261 Melanocytic nevi of right upper limb, including shoulder: Secondary | ICD-10-CM | POA: Diagnosis not present

## 2022-10-05 DIAGNOSIS — D225 Melanocytic nevi of trunk: Secondary | ICD-10-CM | POA: Diagnosis not present

## 2022-10-05 DIAGNOSIS — D2272 Melanocytic nevi of left lower limb, including hip: Secondary | ICD-10-CM | POA: Diagnosis not present

## 2022-10-21 DIAGNOSIS — R5383 Other fatigue: Secondary | ICD-10-CM | POA: Diagnosis not present

## 2023-04-24 DIAGNOSIS — M65342 Trigger finger, left ring finger: Secondary | ICD-10-CM | POA: Diagnosis not present

## 2023-04-24 DIAGNOSIS — M65331 Trigger finger, right middle finger: Secondary | ICD-10-CM | POA: Diagnosis not present

## 2023-10-11 DIAGNOSIS — D2262 Melanocytic nevi of left upper limb, including shoulder: Secondary | ICD-10-CM | POA: Diagnosis not present

## 2023-10-11 DIAGNOSIS — L821 Other seborrheic keratosis: Secondary | ICD-10-CM | POA: Diagnosis not present

## 2023-10-11 DIAGNOSIS — D2272 Melanocytic nevi of left lower limb, including hip: Secondary | ICD-10-CM | POA: Diagnosis not present

## 2023-10-11 DIAGNOSIS — R5383 Other fatigue: Secondary | ICD-10-CM | POA: Diagnosis not present

## 2023-10-11 DIAGNOSIS — D2261 Melanocytic nevi of right upper limb, including shoulder: Secondary | ICD-10-CM | POA: Diagnosis not present

## 2023-10-11 DIAGNOSIS — D2271 Melanocytic nevi of right lower limb, including hip: Secondary | ICD-10-CM | POA: Diagnosis not present

## 2023-10-11 DIAGNOSIS — L570 Actinic keratosis: Secondary | ICD-10-CM | POA: Diagnosis not present

## 2023-10-11 DIAGNOSIS — D225 Melanocytic nevi of trunk: Secondary | ICD-10-CM | POA: Diagnosis not present

## 2023-10-11 LAB — BASIC METABOLIC PANEL
BUN: 19 (ref 4–21)
Chloride: 100 (ref 99–108)
Creatinine: 1 (ref 0.6–1.3)
Glucose: 101
Potassium: 5 meq/L (ref 3.5–5.1)
Sodium: 135 — AB (ref 137–147)

## 2023-10-11 LAB — HEPATIC FUNCTION PANEL
ALT: 50 U/L — AB (ref 10–40)
AST: 40 (ref 14–40)
Alkaline Phosphatase: 70 (ref 25–125)
Bilirubin, Total: 0.3

## 2023-10-11 LAB — COMPREHENSIVE METABOLIC PANEL
Albumin: 4.5 (ref 3.5–5.0)
Calcium: 9.4 (ref 8.7–10.7)
Globulin: 2
eGFR: 81

## 2023-10-11 LAB — CBC AND DIFFERENTIAL
HCT: 38 — AB (ref 41–53)
Hemoglobin: 12.9 — AB (ref 13.5–17.5)
Neutrophils Absolute: 3.5
Platelets: 255 10*3/uL (ref 150–400)
WBC: 5.6

## 2023-10-11 LAB — TSH: TSH: 2.6 (ref 0.41–5.90)

## 2023-10-11 LAB — LIPID PANEL
Cholesterol: 237 — AB (ref 0–200)
HDL: 68 (ref 35–70)
LDL Cholesterol: 153
Triglycerides: 94 (ref 40–160)

## 2023-10-11 LAB — IRON,TIBC AND FERRITIN PANEL: Iron: 86

## 2023-10-11 LAB — CBC: RBC: 4.14 (ref 3.87–5.11)

## 2023-10-11 LAB — PSA: PSA: 0.4

## 2023-10-13 ENCOUNTER — Telehealth: Payer: Self-pay

## 2023-10-13 NOTE — Telephone Encounter (Signed)
Per Dr. Darrick Huntsman she asked if I could find the results to the CMP that pt had done recently. Found the results, printed them off and gave them to Dr. Darrick Huntsman.

## 2023-11-03 ENCOUNTER — Other Ambulatory Visit: Payer: Self-pay

## 2023-11-15 ENCOUNTER — Encounter: Payer: Self-pay | Admitting: Internal Medicine

## 2023-11-15 DIAGNOSIS — R7401 Elevation of levels of liver transaminase levels: Secondary | ICD-10-CM | POA: Insufficient documentation

## 2024-02-28 DIAGNOSIS — H5203 Hypermetropia, bilateral: Secondary | ICD-10-CM | POA: Diagnosis not present

## 2024-02-28 DIAGNOSIS — H25013 Cortical age-related cataract, bilateral: Secondary | ICD-10-CM | POA: Diagnosis not present

## 2024-02-28 DIAGNOSIS — H2513 Age-related nuclear cataract, bilateral: Secondary | ICD-10-CM | POA: Diagnosis not present

## 2024-05-31 DIAGNOSIS — R5383 Other fatigue: Secondary | ICD-10-CM | POA: Diagnosis not present

## 2024-06-01 LAB — LAB REPORT - SCANNED
EGFR: 94
Free T4: 7.5 ng/dL
TSH: 2.85 (ref 0.41–5.90)

## 2024-06-04 ENCOUNTER — Ambulatory Visit: Payer: Self-pay | Admitting: Internal Medicine

## 2024-07-24 DIAGNOSIS — M1611 Unilateral primary osteoarthritis, right hip: Secondary | ICD-10-CM | POA: Diagnosis not present

## 2024-08-23 ENCOUNTER — Telehealth: Payer: Self-pay

## 2024-08-23 DIAGNOSIS — M1611 Unilateral primary osteoarthritis, right hip: Secondary | ICD-10-CM | POA: Diagnosis not present

## 2024-08-23 NOTE — Telephone Encounter (Signed)
 Received a surgical clearance for a right total hip arthoplasty. Surgery is scheduled for 09/26/2024. Does pt need to be schedule for a surgical clearance?   I have placed form in red folder.

## 2024-08-23 NOTE — Telephone Encounter (Signed)
 We received a Preoperative Risk Assessment from [facility] via fax.  I sent a copy to Dr. Verneita Osmond folder on the S drive and hand-delivered a copy to Harlene Sheldon, CMA.

## 2024-08-23 NOTE — Telephone Encounter (Signed)
 LMTCB. Need to get pt scheduled for a surgical clearance about 1 week prior to his surgery that is scheduled for 09/26/2024.

## 2024-08-26 NOTE — Telephone Encounter (Signed)
 Pt is scheduled for 09/04/2024

## 2024-08-28 DIAGNOSIS — R2241 Localized swelling, mass and lump, right lower limb: Secondary | ICD-10-CM | POA: Diagnosis not present

## 2024-08-28 DIAGNOSIS — M7061 Trochanteric bursitis, right hip: Secondary | ICD-10-CM | POA: Diagnosis not present

## 2024-08-28 DIAGNOSIS — M1611 Unilateral primary osteoarthritis, right hip: Secondary | ICD-10-CM | POA: Diagnosis not present

## 2024-08-29 DIAGNOSIS — M7061 Trochanteric bursitis, right hip: Secondary | ICD-10-CM | POA: Diagnosis not present

## 2024-08-29 DIAGNOSIS — M1711 Unilateral primary osteoarthritis, right knee: Secondary | ICD-10-CM | POA: Diagnosis not present

## 2024-09-04 ENCOUNTER — Ambulatory Visit: Admitting: Internal Medicine

## 2024-09-04 DIAGNOSIS — R2241 Localized swelling, mass and lump, right lower limb: Secondary | ICD-10-CM | POA: Diagnosis not present

## 2024-09-05 DIAGNOSIS — M1711 Unilateral primary osteoarthritis, right knee: Secondary | ICD-10-CM | POA: Diagnosis not present

## 2024-09-05 DIAGNOSIS — M1611 Unilateral primary osteoarthritis, right hip: Secondary | ICD-10-CM | POA: Diagnosis not present

## 2024-09-05 DIAGNOSIS — D2121 Benign neoplasm of connective and other soft tissue of right lower limb, including hip: Secondary | ICD-10-CM | POA: Diagnosis not present

## 2024-09-05 DIAGNOSIS — M7061 Trochanteric bursitis, right hip: Secondary | ICD-10-CM | POA: Diagnosis not present

## 2024-09-05 DIAGNOSIS — D1723 Benign lipomatous neoplasm of skin and subcutaneous tissue of right leg: Secondary | ICD-10-CM | POA: Diagnosis not present

## 2024-09-13 DIAGNOSIS — M1611 Unilateral primary osteoarthritis, right hip: Secondary | ICD-10-CM | POA: Diagnosis not present

## 2024-09-16 ENCOUNTER — Telehealth: Payer: Self-pay

## 2024-09-16 NOTE — Telephone Encounter (Signed)
 Copied from CRM [id number]. Topic: Appointments - Scheduling Inquiry for Clinic >> Sep 16, 2024  1:15 PM Alexandria E wrote: Reason for CRM: Patient was previously seen by Dr. Tullo, but has not seen her in several years, patient questioning if PCP would be open to taking him on again. Please call patient and advise.

## 2024-09-17 NOTE — Telephone Encounter (Signed)
 Pt is scheduled for 10/09/2024.

## 2024-10-09 ENCOUNTER — Encounter: Payer: Self-pay | Admitting: Internal Medicine

## 2024-10-09 ENCOUNTER — Ambulatory Visit: Admitting: Internal Medicine

## 2024-10-09 VITALS — BP 134/88 | HR 77 | Ht 69.0 in | Wt 221.4 lb

## 2024-10-09 DIAGNOSIS — R5383 Other fatigue: Secondary | ICD-10-CM

## 2024-10-09 DIAGNOSIS — Z01818 Encounter for other preprocedural examination: Secondary | ICD-10-CM | POA: Diagnosis not present

## 2024-10-09 DIAGNOSIS — E785 Hyperlipidemia, unspecified: Secondary | ICD-10-CM | POA: Diagnosis not present

## 2024-10-09 DIAGNOSIS — Z125 Encounter for screening for malignant neoplasm of prostate: Secondary | ICD-10-CM | POA: Diagnosis not present

## 2024-10-09 DIAGNOSIS — Z113 Encounter for screening for infections with a predominantly sexual mode of transmission: Secondary | ICD-10-CM

## 2024-10-09 NOTE — Progress Notes (Unsigned)
 Subjective:  Patient ID: Eric Vargas, male    DOB: 29-Sep-1956  Age: 68 y.o. MRN: [id number]  CC: The primary encounter diagnosis was Pre-op examination. Diagnoses of Prostate cancer screening, Hyperlipidemia, unspecified hyperlipidemia type, and Other fatigue were also pertinent to this visit.   HPI Eric Vargas presents for  Chief Complaint  Patient presents with  . Pre-op Exam    Preoperative medical clearance, requested by his orthopedist, for future right hip  replacement ,anterior approach   He has not had any primary care follow up in several years,  has no known  history of CAD and  has had no recent episodes of chest pain.  He has a history of fatty liver and elevated blood pressure readings without diagnosis of hypertension (due to being lost to follow up ) .home machines checked weekly  have been 120/80  Outpatient Medications Prior to Visit  Medication Sig Dispense Refill  . Bioflavonoid Products (ESTER C PO) Take by mouth daily.     No facility-administered medications prior to visit.    Review of Systems;  Patient denies headache, fevers, malaise, unintentional weight loss, skin rash, eye pain, sinus congestion and sinus pain, sore throat, dysphagia,  hemoptysis , cough, dyspnea, wheezing, chest pain, palpitations, orthopnea, edema, abdominal pain, nausea, melena, diarrhea, constipation, flank pain, dysuria, hematuria, urinary  Frequency, nocturia, numbness, tingling, seizures,  Focal weakness, Loss of consciousness,  Tremor, insomnia, depression, anxiety, and suicidal ideation.      Objective:  There were no vitals taken for this visit.  BP Readings from Last 3 Encounters:  11/19/21 (!) 148/87  11/16/21 (!) 156/86  05/14/19 (!) 139/96    Wt Readings from Last 3 Encounters:  11/19/21 235 lb (106.6 kg)  11/16/21 237 lb (107.5 kg)  05/11/18 224 lb (101.6 kg)    Physical Exam Vitals reviewed.  Constitutional:      General: He is not in acute  distress.    Appearance: Normal appearance. He is normal weight. He is not ill-appearing, toxic-appearing or diaphoretic.  HENT:     Head: Normocephalic.  Eyes:     General: No scleral icterus.       Right eye: No discharge.        Left eye: No discharge.     Conjunctiva/sclera: Conjunctivae normal.  Cardiovascular:     Rate and Rhythm: Normal rate and regular rhythm.     Heart sounds: Normal heart sounds.  Pulmonary:     Effort: Pulmonary effort is normal. No respiratory distress.     Breath sounds: Normal breath sounds.  Musculoskeletal:        General: Normal range of motion.     Cervical back: Normal range of motion.  Skin:    General: Skin is warm and dry.  Neurological:     General: No focal deficit present.     Mental Status: He is alert and oriented to person, place, and time. Mental status is at baseline.  Psychiatric:        Mood and Affect: Mood normal.        Behavior: Behavior normal.        Thought Content: Thought content normal.        Judgment: Judgment normal.    No results found for: HGBA1C  Lab Results  Component Value Date   CREATININE 1.0 10/11/2023   CREATININE 1.20 11/18/2021   CREATININE 0.9 03/30/2021    Lab Results  Component Value Date   WBC 5.6 10/11/2023  HGB 12.9 (A) 10/11/2023   HCT 38 (A) 10/11/2023   PLT 255 10/11/2023   GLUCOSE 95 03/03/2010   CHOL 237 (A) 10/11/2023   TRIG 94 10/11/2023   HDL 68 10/11/2023   LDLCALC 153 10/11/2023   ALT 50 (A) 10/11/2023   AST 40 10/11/2023   NA 135 (A) 10/11/2023   K 5.0 10/11/2023   CL 100 10/11/2023   CREATININE 1.0 10/11/2023   BUN 19 10/11/2023   CO2 22 03/03/2010   TSH 2.85 06/01/2024   PSA 0.4 10/11/2023    US  Abdomen Limited RUQ (LIVER/GB) Result Date: 12/21/2021 CLINICAL DATA:  Chronic elevation of the LFTs EXAM: ULTRASOUND ABDOMEN LIMITED RIGHT UPPER QUADRANT COMPARISON:  CT from 11/18/2021 FINDINGS: Gallbladder: No gallstones or wall thickening visualized. No sonographic  Murphy sign noted by sonographer. Common bile duct: Diameter: 2.6 mm. Liver: Increased echogenicity is noted throughout the liver consistent with fatty infiltration. No focal mass is noted. Portal vein is patent on color Doppler imaging with normal direction of blood flow towards the liver. Other: None. IMPRESSION: Fatty liver. No other focal abnormality is noted. Electronically Signed   By: Oneil Devonshire M.D.   On: 12/21/2021 23:28    Assessment & Plan:  .Pre-op examination -     EKG 12-Lead  Prostate cancer screening  Hyperlipidemia, unspecified hyperlipidemia type  Other fatigue     I spent 34 minutes on the day of this face to face encounter reviewing patient's  most recent visit with cardiology,  nephrology,  and neurology,  prior relevant surgical and non surgical procedures, recent  labs and imaging studies, counseling on weight management,  reviewing the assessment and plan with patient, and post visit ordering and reviewing of  diagnostics and therapeutics with patient  .   Follow-up: No follow-ups on file.   Eric LITTIE Kettering, MD

## 2024-10-10 NOTE — Assessment & Plan Note (Signed)
 I have ordered and reviewed a 12 lead EKG and find that there are no acute changes and patient is in sinus rhythm.   Patient  is considered to be at low risk  For perioperative complications  Based on today's exam and history.  Baseline lytes,  hgb from July were reviewed and normal

## 2024-10-14 ENCOUNTER — Other Ambulatory Visit: Payer: Self-pay | Admitting: Internal Medicine

## 2024-10-14 ENCOUNTER — Telehealth: Payer: Self-pay

## 2024-10-14 DIAGNOSIS — Z01818 Encounter for other preprocedural examination: Secondary | ICD-10-CM

## 2024-10-14 NOTE — Telephone Encounter (Signed)
 We received a Preoperative Clearance Form from [facility] today via fax for patient.  I sent a copy to Dr. Verneita Osmond folder on the S drive and hand-delivered a copy to Harlene Sheldon, CMA.

## 2024-10-15 ENCOUNTER — Telehealth: Payer: Self-pay

## 2024-10-15 DIAGNOSIS — R5383 Other fatigue: Secondary | ICD-10-CM

## 2024-10-15 DIAGNOSIS — Z01818 Encounter for other preprocedural examination: Secondary | ICD-10-CM

## 2024-10-15 DIAGNOSIS — Z113 Encounter for screening for infections with a predominantly sexual mode of transmission: Secondary | ICD-10-CM

## 2024-10-15 DIAGNOSIS — Z125 Encounter for screening for malignant neoplasm of prostate: Secondary | ICD-10-CM

## 2024-10-15 DIAGNOSIS — E785 Hyperlipidemia, unspecified: Secondary | ICD-10-CM

## 2024-10-15 NOTE — Telephone Encounter (Signed)
 Spoke with pt and he would like to go to [facility] in the morning to have the labs drawn. Lab orders have been changed.   Pt also stated that his surgery has been moved up to 10/29/2024.

## 2024-10-15 NOTE — Telephone Encounter (Signed)
-----   Message from Verneita LITTIE Kettering sent at 10/14/2024  7:55 AM EST ----- Regarding: lab appointment needed for perop labs Correction: he will be getting more than just the 2 labs I mentioned in previous message.  Per his November visit he needs other labs  so a 4 hour fast prior to the lab appt is advised.  I recommend getting the labs done before the end of the year so I can send the form back to the orthopedist

## 2024-10-16 DIAGNOSIS — D2262 Melanocytic nevi of left upper limb, including shoulder: Secondary | ICD-10-CM | POA: Diagnosis not present

## 2024-10-16 DIAGNOSIS — D2261 Melanocytic nevi of right upper limb, including shoulder: Secondary | ICD-10-CM | POA: Diagnosis not present

## 2024-10-16 DIAGNOSIS — D2272 Melanocytic nevi of left lower limb, including hip: Secondary | ICD-10-CM | POA: Diagnosis not present

## 2024-10-16 DIAGNOSIS — L821 Other seborrheic keratosis: Secondary | ICD-10-CM | POA: Diagnosis not present

## 2024-10-16 DIAGNOSIS — L570 Actinic keratosis: Secondary | ICD-10-CM | POA: Diagnosis not present

## 2024-10-16 DIAGNOSIS — D225 Melanocytic nevi of trunk: Secondary | ICD-10-CM | POA: Diagnosis not present

## 2024-10-16 DIAGNOSIS — D2271 Melanocytic nevi of right lower limb, including hip: Secondary | ICD-10-CM | POA: Diagnosis not present

## 2024-10-17 LAB — CBC WITH DIFFERENTIAL/PLATELET
Basophils Absolute: 0.1 x10E3/uL (ref 0.0–0.2)
Basos: 1 %
EOS (ABSOLUTE): 0.1 x10E3/uL (ref 0.0–0.4)
Eos: 2 %
Hematocrit: 38.3 % (ref 37.5–51.0)
Hemoglobin: 12.8 g/dL — ABNORMAL LOW (ref 13.0–17.7)
Immature Grans (Abs): 0 x10E3/uL (ref 0.0–0.1)
Immature Granulocytes: 0 %
Lymphocytes Absolute: 1.3 x10E3/uL (ref 0.7–3.1)
Lymphs: 28 %
MCH: 30.7 pg (ref 26.6–33.0)
MCHC: 33.4 g/dL (ref 31.5–35.7)
MCV: 92 fL (ref 79–97)
Monocytes Absolute: 0.4 x10E3/uL (ref 0.1–0.9)
Monocytes: 8 %
Neutrophils Absolute: 2.9 x10E3/uL (ref 1.4–7.0)
Neutrophils: 61 %
Platelets: 303 x10E3/uL (ref 150–450)
RBC: 4.17 x10E6/uL (ref 4.14–5.80)
RDW: 12.8 % (ref 11.6–15.4)
WBC: 4.8 x10E3/uL (ref 3.4–10.8)

## 2024-10-17 LAB — MICROSCOPIC EXAMINATION
Bacteria, UA: NONE SEEN
Casts: NONE SEEN /LPF
Epithelial Cells (non renal): NONE SEEN /HPF (ref 0–10)
WBC, UA: NONE SEEN /HPF (ref 0–5)

## 2024-10-17 LAB — URINALYSIS, ROUTINE W REFLEX MICROSCOPIC
Bilirubin, UA: NEGATIVE
Glucose, UA: NEGATIVE
Ketones, UA: NEGATIVE
Leukocytes,UA: NEGATIVE
Nitrite, UA: NEGATIVE
Protein,UA: NEGATIVE
Specific Gravity, UA: 1.013 (ref 1.005–1.030)
Urobilinogen, Ur: 0.2 mg/dL (ref 0.2–1.0)
pH, UA: 5.5 (ref 5.0–7.5)

## 2024-10-17 LAB — COMPREHENSIVE METABOLIC PANEL WITH GFR
ALT: 39 IU/L (ref 0–44)
AST: 29 IU/L (ref 0–40)
Albumin: 4.6 g/dL (ref 3.9–4.9)
Alkaline Phosphatase: 73 IU/L (ref 47–123)
BUN/Creatinine Ratio: 23 (ref 10–24)
BUN: 22 mg/dL (ref 8–27)
Bilirubin Total: 0.4 mg/dL (ref 0.0–1.2)
CO2: 23 mmol/L (ref 20–29)
Calcium: 9.8 mg/dL (ref 8.6–10.2)
Chloride: 98 mmol/L (ref 96–106)
Creatinine, Ser: 0.94 mg/dL (ref 0.76–1.27)
Globulin, Total: 2.1 g/dL (ref 1.5–4.5)
Glucose: 100 mg/dL — ABNORMAL HIGH (ref 70–99)
Potassium: 4.8 mmol/L (ref 3.5–5.2)
Sodium: 137 mmol/L (ref 134–144)
Total Protein: 6.7 g/dL (ref 6.0–8.5)
eGFR: 89 mL/min/1.73 (ref >59)

## 2024-10-17 LAB — LIPID PANEL
Chol/HDL Ratio: 3.2 ratio (ref 0.0–5.0)
Cholesterol, Total: 221 mg/dL — ABNORMAL HIGH (ref 100–199)
HDL: 69 mg/dL (ref >39)
LDL Chol Calc (NIH): 135 mg/dL — ABNORMAL HIGH (ref 0–99)
Triglycerides: 98 mg/dL (ref 0–149)
VLDL Cholesterol Cal: 17 mg/dL (ref 5–40)

## 2024-10-17 LAB — HEMOGLOBIN A1C
Est. average glucose Bld gHb Est-mCnc: 123 mg/dL
Hgb A1c MFr Bld: 5.9 % — ABNORMAL HIGH (ref 4.8–5.6)

## 2024-10-17 LAB — TSH: TSH: 2.71 u[IU]/mL (ref 0.450–4.500)

## 2024-10-17 LAB — LDL CHOLESTEROL, DIRECT: LDL Direct: 136 mg/dL — ABNORMAL HIGH (ref 0–99)

## 2024-10-17 LAB — PSA: Prostate Specific Ag, Serum: 0.5 ng/mL (ref 0.0–4.0)

## 2024-10-19 ENCOUNTER — Ambulatory Visit: Payer: Self-pay | Admitting: Internal Medicine

## 2024-10-21 ENCOUNTER — Ambulatory Visit
Admission: RE | Admit: 2024-10-21 | Discharge: 2024-10-21 | Disposition: A | Source: Ambulatory Visit | Attending: Internal Medicine | Admitting: Internal Medicine

## 2024-10-21 ENCOUNTER — Ambulatory Visit
Admission: RE | Admit: 2024-10-21 | Discharge: 2024-10-21 | Disposition: A | Attending: Internal Medicine | Admitting: Internal Medicine

## 2024-10-21 DIAGNOSIS — Z01818 Encounter for other preprocedural examination: Secondary | ICD-10-CM | POA: Insufficient documentation

## 2024-10-25 ENCOUNTER — Ambulatory Visit: Payer: Self-pay | Admitting: Internal Medicine

## 2024-10-29 DIAGNOSIS — M1611 Unilateral primary osteoarthritis, right hip: Secondary | ICD-10-CM | POA: Diagnosis not present
